# Patient Record
Sex: Female | Born: 1990 | Race: Black or African American | Hispanic: No | Marital: Single | Smoking: Never smoker
Health system: Southern US, Community
[De-identification: ages and names within clinical notes are randomized; demographics above are authoritative.]

## PROBLEM LIST (undated history)

## (undated) DIAGNOSIS — D649 Anemia, unspecified: Secondary | ICD-10-CM

## (undated) DIAGNOSIS — O1490 Unspecified pre-eclampsia, unspecified trimester: Secondary | ICD-10-CM

---
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
## 2009-05-06 ENCOUNTER — Emergency Department (HOSPITAL_COMMUNITY): Admission: EM | Admit: 2009-05-06 | Discharge: 2009-05-06 | Payer: Self-pay | Admitting: Emergency Medicine

## 2009-06-18 ENCOUNTER — Emergency Department (HOSPITAL_COMMUNITY): Admission: EM | Admit: 2009-06-18 | Discharge: 2009-06-18 | Payer: Self-pay | Admitting: Emergency Medicine

## 2009-06-19 ENCOUNTER — Emergency Department (HOSPITAL_COMMUNITY): Admission: EM | Admit: 2009-06-19 | Discharge: 2009-06-19 | Payer: Self-pay | Admitting: Emergency Medicine

## 2009-06-24 ENCOUNTER — Emergency Department (HOSPITAL_COMMUNITY): Admission: EM | Admit: 2009-06-24 | Discharge: 2009-06-25 | Payer: Self-pay | Admitting: Emergency Medicine

## 2010-07-28 ENCOUNTER — Emergency Department (HOSPITAL_COMMUNITY)
Admission: EM | Admit: 2010-07-28 | Discharge: 2010-07-28 | Payer: Self-pay | Source: Home / Self Care | Admitting: Emergency Medicine

## 2010-12-23 LAB — URINALYSIS, ROUTINE W REFLEX MICROSCOPIC
Glucose, UA: NEGATIVE mg/dL
Ketones, ur: NEGATIVE mg/dL
Nitrite: NEGATIVE
Specific Gravity, Urine: 1.024 (ref 1.005–1.030)
pH: 6.5 (ref 5.0–8.0)

## 2010-12-23 LAB — PREGNANCY, URINE: Preg Test, Ur: NEGATIVE

## 2010-12-23 LAB — URINE MICROSCOPIC-ADD ON

## 2011-01-15 LAB — COMPREHENSIVE METABOLIC PANEL
BUN: 8 mg/dL (ref 6–23)
CO2: 32 mEq/L (ref 19–32)
Calcium: 9.2 mg/dL (ref 8.4–10.5)
Creatinine, Ser: 0.77 mg/dL (ref 0.4–1.2)
GFR calc non Af Amer: >60 mL/min (ref >60)
Glucose, Bld: 105 mg/dL — ABNORMAL HIGH (ref 70–99)

## 2011-01-15 LAB — URINALYSIS, ROUTINE W REFLEX MICROSCOPIC
Bilirubin Urine: NEGATIVE
Ketones, ur: NEGATIVE mg/dL
Nitrite: NEGATIVE
Specific Gravity, Urine: 1.025 (ref 1.005–1.030)
Urobilinogen, UA: 1 mg/dL (ref 0.0–1.0)

## 2011-01-15 LAB — DIFFERENTIAL
Eosinophils Absolute: 0 10*3/uL (ref 0.0–0.7)
Lymphocytes Relative: 10 % — ABNORMAL LOW (ref 12–46)
Lymphs Abs: 1.5 10*3/uL (ref 0.7–4.0)
Neutro Abs: 12.8 10*3/uL — ABNORMAL HIGH (ref 1.7–7.7)
Neutrophils Relative %: 85 % — ABNORMAL HIGH (ref 43–77)

## 2011-01-15 LAB — POCT PREGNANCY, URINE: Preg Test, Ur: NEGATIVE

## 2011-01-15 LAB — CBC
Hemoglobin: 11.7 g/dL — ABNORMAL LOW (ref 12.0–15.0)
MCHC: 33.5 g/dL (ref 30.0–36.0)
MCV: 90.3 fL (ref 78.0–100.0)
RBC: 3.86 MIL/uL — ABNORMAL LOW (ref 3.87–5.11)
RDW: 12.4 % (ref 11.5–15.5)

## 2011-01-15 LAB — LIPASE, BLOOD: Lipase: 17 U/L (ref 11–59)

## 2011-01-26 LAB — POCT RAPID STREP A (OFFICE): Streptococcus, Group A Screen (Direct): NEGATIVE

## 2011-01-27 ENCOUNTER — Emergency Department (HOSPITAL_COMMUNITY)
Admission: EM | Admit: 2011-01-27 | Discharge: 2011-01-27 | Disposition: A | Discharge status: Home / Self Care | Payer: Self-pay | Attending: Emergency Medicine | Admitting: Emergency Medicine

## 2011-01-27 DIAGNOSIS — L298 Other pruritus: Secondary | ICD-10-CM | POA: Insufficient documentation

## 2011-01-27 DIAGNOSIS — L509 Urticaria, unspecified: Secondary | ICD-10-CM | POA: Insufficient documentation

## 2011-01-27 DIAGNOSIS — L2989 Other pruritus: Secondary | ICD-10-CM | POA: Insufficient documentation

## 2011-01-27 DIAGNOSIS — T7840XA Allergy, unspecified, initial encounter: Secondary | ICD-10-CM | POA: Insufficient documentation

## 2011-01-27 DIAGNOSIS — R21 Rash and other nonspecific skin eruption: Secondary | ICD-10-CM | POA: Insufficient documentation

## 2011-01-27 DIAGNOSIS — I10 Essential (primary) hypertension: Secondary | ICD-10-CM | POA: Insufficient documentation

## 2011-03-07 ENCOUNTER — Emergency Department (HOSPITAL_COMMUNITY)
Admission: EM | Admit: 2011-03-07 | Discharge: 2011-03-08 | Disposition: A | Discharge status: Home / Self Care | Payer: Self-pay | Attending: Emergency Medicine | Admitting: Emergency Medicine

## 2011-03-07 DIAGNOSIS — L299 Pruritus, unspecified: Secondary | ICD-10-CM | POA: Insufficient documentation

## 2011-03-07 DIAGNOSIS — M79609 Pain in unspecified limb: Secondary | ICD-10-CM | POA: Insufficient documentation

## 2011-03-07 DIAGNOSIS — L509 Urticaria, unspecified: Secondary | ICD-10-CM | POA: Insufficient documentation

## 2011-03-28 ENCOUNTER — Emergency Department (HOSPITAL_COMMUNITY)
Admission: EM | Admit: 2011-03-28 | Discharge: 2011-03-28 | Disposition: A | Discharge status: Home / Self Care | Payer: Self-pay | Attending: Emergency Medicine | Admitting: Emergency Medicine

## 2011-03-28 DIAGNOSIS — H01009 Unspecified blepharitis unspecified eye, unspecified eyelid: Secondary | ICD-10-CM | POA: Insufficient documentation

## 2011-03-28 DIAGNOSIS — H5789 Other specified disorders of eye and adnexa: Secondary | ICD-10-CM | POA: Insufficient documentation

## 2011-07-13 LAB — URINE MICROSCOPIC-ADD ON

## 2011-07-13 LAB — WET PREP, GENITAL: Trich, Wet Prep: NONE SEEN

## 2011-07-13 LAB — CBC
MCV: 88.8
RBC: 4.1
WBC: 6.5

## 2011-07-13 LAB — POCT I-STAT, CHEM 8
Calcium, Ion: 1.14
Glucose, Bld: 90
HCT: 37
Hemoglobin: 12.6

## 2011-07-13 LAB — POCT PREGNANCY, URINE: Preg Test, Ur: NEGATIVE

## 2011-07-13 LAB — URINALYSIS, ROUTINE W REFLEX MICROSCOPIC
Ketones, ur: NEGATIVE
Nitrite: NEGATIVE
pH: 6

## 2011-07-13 LAB — DIFFERENTIAL
Lymphs Abs: 2.7
Monocytes Relative: 8
Neutro Abs: 3.1
Neutrophils Relative %: 48

## 2011-07-13 LAB — GC/CHLAMYDIA PROBE AMP, GENITAL
Chlamydia, DNA Probe: NEGATIVE
GC Probe Amp, Genital: NEGATIVE

## 2012-06-14 ENCOUNTER — Other Ambulatory Visit (HOSPITAL_COMMUNITY): Payer: Self-pay | Admitting: Obstetrics and Gynecology

## 2012-06-14 DIAGNOSIS — Z3689 Encounter for other specified antenatal screening: Secondary | ICD-10-CM

## 2012-06-15 ENCOUNTER — Encounter (HOSPITAL_COMMUNITY): Payer: Self-pay

## 2012-06-15 ENCOUNTER — Ambulatory Visit (HOSPITAL_COMMUNITY)
Admission: RE | Admit: 2012-06-15 | Discharge: 2012-06-15 | Disposition: A | Discharge status: Home / Self Care | Payer: Medicaid Other | Source: Ambulatory Visit | Attending: Obstetrics and Gynecology | Admitting: Obstetrics and Gynecology

## 2012-06-15 DIAGNOSIS — O358XX0 Maternal care for other (suspected) fetal abnormality and damage, not applicable or unspecified: Secondary | ICD-10-CM | POA: Insufficient documentation

## 2012-06-15 DIAGNOSIS — Z3689 Encounter for other specified antenatal screening: Secondary | ICD-10-CM

## 2012-06-15 DIAGNOSIS — Z1389 Encounter for screening for other disorder: Secondary | ICD-10-CM | POA: Insufficient documentation

## 2012-06-15 DIAGNOSIS — Z363 Encounter for antenatal screening for malformations: Secondary | ICD-10-CM | POA: Insufficient documentation

## 2012-08-09 ENCOUNTER — Other Ambulatory Visit: Payer: Self-pay

## 2012-08-15 ENCOUNTER — Other Ambulatory Visit (HOSPITAL_COMMUNITY): Payer: Self-pay | Admitting: Obstetrics and Gynecology

## 2012-08-15 DIAGNOSIS — IMO0002 Reserved for concepts with insufficient information to code with codable children: Secondary | ICD-10-CM

## 2012-08-16 ENCOUNTER — Ambulatory Visit (HOSPITAL_COMMUNITY)
Admission: RE | Admit: 2012-08-16 | Discharge: 2012-08-16 | Disposition: A | Discharge status: Home / Self Care | Payer: Medicaid Other | Source: Ambulatory Visit | Attending: Obstetrics and Gynecology | Admitting: Obstetrics and Gynecology

## 2012-08-16 ENCOUNTER — Inpatient Hospital Stay (HOSPITAL_COMMUNITY)
Admission: AD | Admit: 2012-08-16 | Discharge: 2012-08-20 | DRG: 765 | Disposition: A | Discharge status: Home / Self Care | Payer: Medicaid Other | Source: Ambulatory Visit | Attending: Obstetrics and Gynecology | Admitting: Obstetrics and Gynecology

## 2012-08-16 ENCOUNTER — Encounter (HOSPITAL_COMMUNITY): Payer: Self-pay

## 2012-08-16 VITALS — BP 149/104 | HR 60 | Wt 133.8 lb

## 2012-08-16 DIAGNOSIS — Z1389 Encounter for screening for other disorder: Secondary | ICD-10-CM | POA: Insufficient documentation

## 2012-08-16 DIAGNOSIS — Z363 Encounter for antenatal screening for malformations: Secondary | ICD-10-CM | POA: Insufficient documentation

## 2012-08-16 DIAGNOSIS — O1490 Unspecified pre-eclampsia, unspecified trimester: Secondary | ICD-10-CM

## 2012-08-16 DIAGNOSIS — O365990 Maternal care for other known or suspected poor fetal growth, unspecified trimester, not applicable or unspecified: Secondary | ICD-10-CM | POA: Diagnosis present

## 2012-08-16 DIAGNOSIS — IMO0002 Reserved for concepts with insufficient information to code with codable children: Secondary | ICD-10-CM

## 2012-08-16 DIAGNOSIS — O358XX0 Maternal care for other (suspected) fetal abnormality and damage, not applicable or unspecified: Secondary | ICD-10-CM | POA: Insufficient documentation

## 2012-08-16 DIAGNOSIS — O151 Eclampsia in labor: Principal | ICD-10-CM | POA: Diagnosis present

## 2012-08-16 HISTORY — DX: Unspecified pre-eclampsia, unspecified trimester: O14.90

## 2012-08-16 HISTORY — DX: Anemia, unspecified: D64.9

## 2012-08-16 LAB — COMPREHENSIVE METABOLIC PANEL
Albumin: 2.6 g/dL — ABNORMAL LOW (ref 3.5–5.2)
Alkaline Phosphatase: 174 U/L — ABNORMAL HIGH (ref 39–117)
BUN: 12 mg/dL (ref 6–23)
Calcium: 8.8 mg/dL (ref 8.4–10.5)
Creatinine, Ser: 0.79 mg/dL (ref 0.50–1.10)
GFR calc Af Amer: >90 mL/min (ref >90)
Glucose, Bld: 70 mg/dL (ref 70–99)
Potassium: 3.8 mEq/L (ref 3.5–5.1)
Total Protein: 6.4 g/dL (ref 6.0–8.3)

## 2012-08-16 LAB — URINE MICROSCOPIC-ADD ON

## 2012-08-16 LAB — OB RESULTS CONSOLE ABO/RH: RH Type: POSITIVE

## 2012-08-16 LAB — CBC
HCT: 32.3 % — ABNORMAL LOW (ref 36.0–46.0)
MCH: 30.6 pg (ref 26.0–34.0)
MCHC: 35.3 g/dL (ref 30.0–36.0)
RDW: 13.1 % (ref 11.5–15.5)

## 2012-08-16 LAB — URINALYSIS, ROUTINE W REFLEX MICROSCOPIC
Glucose, UA: NEGATIVE mg/dL
Hgb urine dipstick: NEGATIVE
Specific Gravity, Urine: 1.025 (ref 1.005–1.030)
pH: 6.5 (ref 5.0–8.0)

## 2012-08-16 LAB — URIC ACID: Uric Acid, Serum: 7.5 mg/dL — ABNORMAL HIGH (ref 2.4–7.0)

## 2012-08-16 LAB — OB RESULTS CONSOLE ANTIBODY SCREEN: Antibody Screen: NEGATIVE

## 2012-08-16 LAB — OB RESULTS CONSOLE RUBELLA ANTIBODY, IGM: Rubella: IMMUNE

## 2012-08-16 LAB — OB RESULTS CONSOLE HEPATITIS B SURFACE ANTIGEN: Hepatitis B Surface Ag: NEGATIVE

## 2012-08-16 LAB — OB RESULTS CONSOLE RPR: RPR: NONREACTIVE

## 2012-08-16 LAB — LACTATE DEHYDROGENASE: LDH: 281 U/L — ABNORMAL HIGH (ref 94–250)

## 2012-08-16 MED ORDER — BETAMETHASONE SOD PHOS & ACET 6 (3-3) MG/ML IJ SUSP
12.0000 mg | Freq: Once | INTRAMUSCULAR | Status: AC
  Administered 2012-08-17: 12 mg via INTRAMUSCULAR
  Filled 2012-08-16: qty 2

## 2012-08-16 MED ORDER — PRENATAL MULTIVITAMIN CH
1.0000 | ORAL_TABLET | Freq: Every day | ORAL | Status: DC
  Administered 2012-08-17: 1 via ORAL
  Filled 2012-08-16: qty 1

## 2012-08-16 MED ORDER — ACETAMINOPHEN 325 MG PO TABS: 650.0000 mg | ORAL_TABLET | ORAL | Status: DC | PRN

## 2012-08-16 MED ORDER — ZOLPIDEM TARTRATE 5 MG PO TABS: 5.0000 mg | ORAL_TABLET | Freq: Every evening | ORAL | Status: DC | PRN

## 2012-08-16 MED ORDER — DOCUSATE SODIUM 100 MG PO CAPS
100.0000 mg | ORAL_CAPSULE | Freq: Every day | ORAL | Status: DC
  Administered 2012-08-17: 100 mg via ORAL
  Filled 2012-08-16: qty 1

## 2012-08-16 MED ORDER — CALCIUM CARBONATE ANTACID 500 MG PO CHEW: 2.0000 | CHEWABLE_TABLET | ORAL | Status: DC | PRN

## 2012-08-16 MED ORDER — BETAMETHASONE SOD PHOS & ACET 6 (3-3) MG/ML IJ SUSP
12.0000 mg | Freq: Once | INTRAMUSCULAR | Status: AC
  Administered 2012-08-16: 12 mg via INTRAMUSCULAR
  Filled 2012-08-16: qty 2

## 2012-08-16 NOTE — MAU Note (Signed)
Patient is sent from maternal fetal medicine due to elevated bp (she went for an ultrasound due to possible sga). She states that it is the first time she is aware of high bp. Patient denies visual disturbance, epigastric pain, vaginal bleeding or lof. She c/o headache 4/10. She reports good fetal movement. 1+ reflexes  And no clonus.

## 2012-08-16 NOTE — Progress Notes (Signed)
Dr Ellyn Hack called and gave orders to admit to antenatal, administer first dose of betamethasone 12mg  im and start 24 hour urine collections. She states that she will enter the rest of admission orders.

## 2012-08-16 NOTE — MAU Provider Note (Signed)
  History     CSN: [id number]  Arrival date and time: 08/16/12 1213   None     Chief Complaint  Patient presents with  . Hypertension   HPI  Pt is [redacted]w[redacted]d pregnant and presents from MFM after having BP elevation. She has been seen for serial ultrasounds for SGA.  She has an occ contraction.  She has a frontal headache since she woke up this morning.  She has eaten cereal this morning at 10 am and has drunk a little water.    Past Medical History  Diagnosis Date  . Anemia     History reviewed. No pertinent past surgical history.  History reviewed. No pertinent family history.  History  Substance Use Topics  . Smoking status: Never Smoker   . Smokeless tobacco: Not on file  . Alcohol Use: No    Allergies: No Known Allergies  Prescriptions prior to admission  Medication Sig Dispense Refill  . ferrous sulfate 325 (65 FE) MG tablet Take 325 mg by mouth 2 (two) times daily.      . Prenatal Vit-Fe Fumarate-FA (PRENATAL MULTIVITAMIN) TABS Take 1 tablet by mouth daily.        ROS Physical Exam   Last menstrual period 12/12/2011.  Physical Exam  MAU Course  Procedures Discussed with Dr. Ellyn Hack- will go ahead and give 1st dose Betamethasone Awaiting labs- Dr. Ellyn Hack will admit  Assessment and Plan    Olivia Farrell 08/16/2012, 1:19 PM

## 2012-08-16 NOTE — Addendum Note (Signed)
Encounter addended by: Durwin Nora, MD on: 08/16/2012  2:38 PM<BR>     Documentation filed: Visit Diagnoses

## 2012-08-16 NOTE — H&P (Signed)
Olivia Farrell is a 21 y.o. female severe HTN, PreEclampsia admitted for 24hr urine, also with SGA baby (20%) and grade 2-3 placenta, evaluated in MFM today.  Late PNC at 24week, dated by 24 wk [place], nl anat, EIF noted.. Maternal Medical History:  Fetal activity: Perceived fetal activity is normal.    Prenatal complications: Hypertension and pre-eclampsia.     OB History    Grav Para Term Preterm Abortions TAB SAB Ect Mult Living   1             G1 present, late PNC, no abn pap, + Chl Past Medical History  Diagnosis Date  . Anemia   . Preeclampsia 08/16/2012   History reviewed. No pertinent past surgical history. Family History: Colon CA, DM type 1, HTN, kidney dz Social History:  reports that she has never smoked. She does not have any smokeless tobacco history on file. She reports that she does not drink alcohol or use illicit drugs. Meds PNV All NKDA   Prenatal Transfer Tool  Maternal Diabetes: No Genetic Screening: Declined too late to care, EIF noted on [place] Maternal Ultrasounds/Referrals: Abnormal:  Findings:   Isolated EIF (echogenic intracardiac focus), Other: late care, grade 2-3 placenta Fetal Ultrasounds or other Referrals:  Referred to Materal Fetal Medicine  Maternal Substance Abuse:  No Significant Maternal Medications:  None Significant Maternal Lab Results:  None Other Comments:  late to care - 24 week  Review of Systems  Constitutional: Negative.   Eyes: Positive for blurred vision.  Respiratory: Negative.   Cardiovascular: Negative.   Gastrointestinal: Negative.   Genitourinary: Negative.   Musculoskeletal: Negative.   Skin: Negative.   Neurological: Positive for headaches.  Psychiatric/Behavioral: Negative.       Blood pressure 141/75, pulse 61, temperature 98.2 F (36.8 C), temperature source Oral, resp. rate 24, height 5\' 1"  (1.549 m), weight 59.875 kg (132 lb), last menstrual period 12/12/2011. Maternal Exam:  Abdomen: Fundal height is  appropriate for gestation.   Estimated fetal weight is 28% on [place] 11/6.    Introitus: Normal vulva. Normal vagina.    Physical Exam  Constitutional: She is oriented to person, place, and time. She appears well-developed and well-nourished.  HENT:  Head: Normocephalic.  Eyes: Conjunctivae normal are normal. Pupils are equal, round, and reactive to light.  Neck: Normal range of motion. Neck supple.  Cardiovascular: Normal rate and regular rhythm.   Respiratory: Effort normal and breath sounds normal. No respiratory distress.  GI: Soft. Bowel sounds are normal. There is no tenderness.  Musculoskeletal: Normal range of motion.  Neurological: She is alert and oriented to person, place, and time.  Skin: Skin is warm and dry.  Psychiatric: She has a normal mood and affect. Her behavior is normal.    Prenatal labs: ABO, Rh:  B+ Antibody:  neg Rubella:  immune RPR:   NR HBsAg:   neg HIV:   neg GBS:   unknown Hgb 9.6/ glucol 113/ pap WNL/ Ur Cx neg/ Plt 304K/ Hgb electro WNL/ GC neg/ Chl +/ CF neg/   [place] dated by 24 wk [place] Nl anat, except EIF, grade 2-3 placenta EDC 12/26  Tdap 9/25 Flu 10/8  Assessment/Plan: 21yo G1P0 at 32+ with PreE, BP very elevated in MFM, have been WNL at office.  Growth 28% on [place], nl AFI, nl dopplers (not documented) Getting BMZ 24hr urine D/w pt POC   BOVARD,Nobel Brar 08/16/2012, 6:14 PM

## 2012-08-16 NOTE — Progress Notes (Signed)
Olivia Farrell had an ultrasound appointment today.  Please see AS-OB/GYN report for details.  Comments An active singleton fetus is observed.  Biometry is appropriate for gestational age, noting overall symmetric fetal growth pattern (HC/AC 1.02) and EFW at the 28th percentile. AC is at the 10th percentile.  Amniotic fluid volume is normal.  Screening survey of the fetal anatomy was performed and no structural defects are detected but there is an echogenic intracardiac focus in the left ventricle.    Today's discussion was limited to pertinent ultrasound findings.  In addition, there is an echogenic intracardiac focus of the left ventricle. I demonstrated the echogenic intracardiac focus (EIF) on the ultrasound screen.  This finding has been implicated as a marker for increased risk of aneuploidy, especially Trisomy 21. However, it is also seen as a normal variant in 2-4% of all pregnancy ultrasound exams.  Although its significance as an isolated finding is not entirely clear, a consensus opinion would be that an isolated EIF (with no other risk factors or suspicious ultrasound findings) does not significantly increase risk of aneuploidy.  With her relatively young age and absence of other sonographic findings,her risk for chromosome abnormality is not increased to the point that amniocentesis is recommended. Therefore, I did not specifically mention this to your patient as for her this finding is essentially a "normal variant" and does not change her risk stratification as she remains "low risk."  Furthermore, echogenic focus is not a "heart defect" and no special fetal surveillance or neonatal cardiac evaluation is recommended.  Moreover, the patient was noted to have BP's of 159/98 and 148/109, prompting my call to Dr. Ebony Hail office to notify them that the patient was being sent to the MAU for evaluation for possible preeclampsia.  Impression Active singleton fetus EIF EFW at 28th percentile  with [facility] at the 10th percentile Severe hypertension, needs evaluation for preeclampsia  Recommendations 1. The patient was sent to MAU for evaluation of possible preeclampsia. 2. Due to borderline AC measurement, I would consider reassessing interval growth in 3-4 weeks by ultrasound, provided no clinical indication for delivery is found in the interim. 3. Follow up as clinically indicated.  Rogelia Boga, MD, MS, FACOG Assistant Professor Section of Maternal-Fetal Medicine [facility]

## 2012-08-17 ENCOUNTER — Encounter (HOSPITAL_COMMUNITY)
Admission: AD | Disposition: A | Discharge status: Home / Self Care | Payer: Self-pay | Source: Ambulatory Visit | Attending: Obstetrics and Gynecology

## 2012-08-17 ENCOUNTER — Inpatient Hospital Stay (HOSPITAL_COMMUNITY): Payer: Medicaid Other

## 2012-08-17 ENCOUNTER — Encounter (HOSPITAL_COMMUNITY): Payer: Self-pay | Admitting: Anesthesiology

## 2012-08-17 ENCOUNTER — Encounter (HOSPITAL_COMMUNITY): Payer: Self-pay

## 2012-08-17 ENCOUNTER — Encounter (HOSPITAL_COMMUNITY): Payer: Self-pay | Admitting: *Deleted

## 2012-08-17 LAB — CBC
HCT: 33.2 % — ABNORMAL LOW (ref 36.0–46.0)
HCT: 36.6 % (ref 36.0–46.0)
Hemoglobin: 11.7 g/dL — ABNORMAL LOW (ref 12.0–15.0)
Hemoglobin: 12.5 g/dL (ref 12.0–15.0)
MCH: 30.2 pg (ref 26.0–34.0)
MCHC: 34.2 g/dL (ref 30.0–36.0)
MCV: 87.1 fL (ref 78.0–100.0)
RBC: 3.81 MIL/uL — ABNORMAL LOW (ref 3.87–5.11)
RDW: 13.2 % (ref 11.5–15.5)
WBC: 13.4 10*3/uL — ABNORMAL HIGH (ref 4.0–10.5)

## 2012-08-17 LAB — BASIC METABOLIC PANEL
BUN: 17 mg/dL (ref 6–23)
CO2: 19 mEq/L (ref 19–32)
Chloride: 100 mEq/L (ref 96–112)
Creatinine, Ser: 0.77 mg/dL (ref 0.50–1.10)
GFR calc Af Amer: >90 mL/min (ref >90)
Potassium: 4.2 mEq/L (ref 3.5–5.1)

## 2012-08-17 LAB — COMPREHENSIVE METABOLIC PANEL WITH GFR
ALT: 12 U/L (ref 0–35)
AST: 30 U/L (ref 0–37)
Albumin: 2.9 g/dL — ABNORMAL LOW (ref 3.5–5.2)
Alkaline Phosphatase: 217 U/L — ABNORMAL HIGH (ref 39–117)
BUN: 16 mg/dL (ref 6–23)
CO2: 14 meq/L — ABNORMAL LOW (ref 19–32)
Calcium: 9.5 mg/dL (ref 8.4–10.5)
Chloride: 97 meq/L (ref 96–112)
Creatinine, Ser: 0.89 mg/dL (ref 0.50–1.10)
GFR calc Af Amer: >90 mL/min
GFR calc non Af Amer: >90 mL/min
Glucose, Bld: 102 mg/dL — ABNORMAL HIGH (ref 70–99)
Potassium: 3.6 meq/L (ref 3.5–5.1)
Sodium: 134 meq/L — ABNORMAL LOW (ref 135–145)
Total Bilirubin: 0.3 mg/dL (ref 0.3–1.2)
Total Protein: 6.8 g/dL (ref 6.0–8.3)

## 2012-08-17 LAB — CREATININE CLEARANCE, URINE, 24 HOUR
Collection Interval-CRCL: 24 hours
Creatinine, Urine: 98.52 mg/dL
Urine Total Volume-CRCL: 1300 mL

## 2012-08-17 LAB — PROTIME-INR: Prothrombin Time: 12.2 seconds (ref 11.6–15.2)

## 2012-08-17 LAB — LACTATE DEHYDROGENASE: LDH: 390 U/L — ABNORMAL HIGH (ref 94–250)

## 2012-08-17 LAB — PROTEIN, URINE, 24 HOUR: Protein, 24H Urine: 3471 mg/d — ABNORMAL HIGH (ref 50–100)

## 2012-08-17 SURGERY — Surgical Case
Anesthesia: Spinal | Site: Abdomen | Wound class: Clean Contaminated

## 2012-08-17 MED ORDER — PHENYLEPHRINE 40 MCG/ML (10ML) SYRINGE FOR IV PUSH (FOR BLOOD PRESSURE SUPPORT): 80.0000 ug | PREFILLED_SYRINGE | INTRAVENOUS | Status: DC | PRN

## 2012-08-17 MED ORDER — MORPHINE SULFATE (PF) 0.5 MG/ML IJ SOLN
INTRAMUSCULAR | Status: DC | PRN
  Administered 2012-08-17: .1 mg via INTRATHECAL

## 2012-08-17 MED ORDER — LIDOCAINE HCL (PF) 1 % IJ SOLN: 30.0000 mL | INTRAMUSCULAR | Status: DC | PRN

## 2012-08-17 MED ORDER — PRENATAL MULTIVITAMIN CH
1.0000 | ORAL_TABLET | Freq: Every day | ORAL | Status: DC
  Administered 2012-08-18 – 2012-08-19 (×2): 1 via ORAL
  Filled 2012-08-17 (×2): qty 1

## 2012-08-17 MED ORDER — SENNOSIDES-DOCUSATE SODIUM 8.6-50 MG PO TABS
2.0000 | ORAL_TABLET | Freq: Every day | ORAL | Status: DC
  Administered 2012-08-18 – 2012-08-19 (×3): 2 via ORAL

## 2012-08-17 MED ORDER — OXYTOCIN 40 UNITS IN LACTATED RINGERS INFUSION - SIMPLE MED: 62.5000 mL/h | INTRAVENOUS | Status: DC

## 2012-08-17 MED ORDER — METOCLOPRAMIDE HCL 5 MG/ML IJ SOLN: 10.0000 mg | Freq: Three times a day (TID) | INTRAMUSCULAR | Status: DC | PRN

## 2012-08-17 MED ORDER — KETOROLAC TROMETHAMINE 30 MG/ML IJ SOLN
30.0000 mg | Freq: Four times a day (QID) | INTRAMUSCULAR | Status: AC | PRN
  Administered 2012-08-17: 30 mg via INTRAVENOUS

## 2012-08-17 MED ORDER — FAMOTIDINE IN NACL 20-0.9 MG/50ML-% IV SOLN
20.0000 mg | Freq: Once | INTRAVENOUS | Status: AC
  Administered 2012-08-17: 20 mg via INTRAVENOUS
  Filled 2012-08-17: qty 50

## 2012-08-17 MED ORDER — DIPHENHYDRAMINE HCL 50 MG/ML IJ SOLN
12.5000 mg | INTRAMUSCULAR | Status: DC | PRN
  Administered 2012-08-18: 12.5 mg via INTRAVENOUS
  Filled 2012-08-17: qty 1

## 2012-08-17 MED ORDER — CEFAZOLIN SODIUM-DEXTROSE 2-3 GM-% IV SOLR
INTRAVENOUS | Status: DC | PRN
  Administered 2012-08-17: 2 g via INTRAVENOUS

## 2012-08-17 MED ORDER — DEXTROSE-NACL 5-0.45 % IV SOLN
INTRAVENOUS | Status: DC
  Administered 2012-08-18 – 2012-08-19 (×3): via INTRAVENOUS

## 2012-08-17 MED ORDER — LACTATED RINGERS IV SOLN
INTRAVENOUS | Status: DC | PRN
  Administered 2012-08-17: 19:00:00 via INTRAVENOUS

## 2012-08-17 MED ORDER — EPHEDRINE 5 MG/ML INJ: 10.0000 mg | INTRAVENOUS | Status: DC | PRN

## 2012-08-17 MED ORDER — MIDAZOLAM HCL 2 MG/2ML IJ SOLN: 0.5000 mg | Freq: Once | INTRAMUSCULAR | Status: DC | PRN

## 2012-08-17 MED ORDER — MEPERIDINE HCL 25 MG/ML IJ SOLN
INTRAMUSCULAR | Status: AC
  Filled 2012-08-17: qty 1

## 2012-08-17 MED ORDER — LANOLIN HYDROUS EX OINT: 1.0000 "application " | TOPICAL_OINTMENT | CUTANEOUS | Status: DC | PRN

## 2012-08-17 MED ORDER — PENICILLIN G POTASSIUM 5000000 UNITS IJ SOLR
2.5000 10*6.[IU] | INTRAVENOUS | Status: DC
  Filled 2012-08-17 (×3): qty 2.5

## 2012-08-17 MED ORDER — SODIUM CHLORIDE 0.9 % IV SOLN
1.0000 ug/kg/h | INTRAVENOUS | Status: DC | PRN
  Filled 2012-08-17: qty 2.5

## 2012-08-17 MED ORDER — FENTANYL CITRATE 0.05 MG/ML IJ SOLN: 25.0000 ug | INTRAMUSCULAR | Status: DC | PRN

## 2012-08-17 MED ORDER — NALBUPHINE SYRINGE 5 MG/0.5 ML
5.0000 mg | INJECTION | INTRAMUSCULAR | Status: DC | PRN
  Filled 2012-08-17: qty 1

## 2012-08-17 MED ORDER — ONDANSETRON HCL 4 MG PO TABS: 4.0000 mg | ORAL_TABLET | ORAL | Status: DC | PRN

## 2012-08-17 MED ORDER — NALBUPHINE HCL 10 MG/ML IJ SOLN
5.0000 mg | INTRAMUSCULAR | Status: DC | PRN
  Filled 2012-08-17: qty 1

## 2012-08-17 MED ORDER — ACETAMINOPHEN 325 MG PO TABS: 650.0000 mg | ORAL_TABLET | ORAL | Status: DC | PRN

## 2012-08-17 MED ORDER — DIPHENHYDRAMINE HCL 50 MG/ML IJ SOLN: 12.5000 mg | INTRAMUSCULAR | Status: DC | PRN

## 2012-08-17 MED ORDER — ONDANSETRON HCL 4 MG/2ML IJ SOLN: 4.0000 mg | INTRAMUSCULAR | Status: DC | PRN

## 2012-08-17 MED ORDER — OXYCODONE-ACETAMINOPHEN 5-325 MG PO TABS: 1.0000 | ORAL_TABLET | ORAL | Status: DC | PRN

## 2012-08-17 MED ORDER — SCOPOLAMINE 1 MG/3DAYS TD PT72
MEDICATED_PATCH | TRANSDERMAL | Status: AC
  Filled 2012-08-17: qty 1

## 2012-08-17 MED ORDER — BUPIVACAINE IN DEXTROSE 0.75-8.25 % IT SOLN
INTRATHECAL | Status: DC | PRN
  Administered 2012-08-17: 1.5 mL via INTRATHECAL

## 2012-08-17 MED ORDER — MIDAZOLAM HCL 2 MG/2ML IJ SOLN
INTRAMUSCULAR | Status: AC
  Filled 2012-08-17: qty 2

## 2012-08-17 MED ORDER — FENTANYL CITRATE 0.05 MG/ML IJ SOLN
INTRAMUSCULAR | Status: AC
  Filled 2012-08-17: qty 2

## 2012-08-17 MED ORDER — IBUPROFEN 600 MG PO TABS
600.0000 mg | ORAL_TABLET | Freq: Four times a day (QID) | ORAL | Status: DC
  Administered 2012-08-18 – 2012-08-20 (×10): 600 mg via ORAL
  Filled 2012-08-17 (×11): qty 1

## 2012-08-17 MED ORDER — PENICILLIN G POTASSIUM 5000000 UNITS IJ SOLR
5.0000 10*6.[IU] | Freq: Once | INTRAVENOUS | Status: AC
  Administered 2012-08-17: 5 10*6.[IU] via INTRAVENOUS
  Filled 2012-08-17: qty 5

## 2012-08-17 MED ORDER — DIBUCAINE 1 % RE OINT: 1.0000 "application " | TOPICAL_OINTMENT | RECTAL | Status: DC | PRN

## 2012-08-17 MED ORDER — DIAZEPAM 5 MG/ML IJ SOLN
INTRAMUSCULAR | Status: AC
  Administered 2012-08-17: 2 mg
  Filled 2012-08-17: qty 2

## 2012-08-17 MED ORDER — OXYCODONE-ACETAMINOPHEN 5-325 MG PO TABS
1.0000 | ORAL_TABLET | ORAL | Status: DC | PRN
  Administered 2012-08-19 – 2012-08-20 (×3): 1 via ORAL
  Filled 2012-08-17 (×3): qty 1

## 2012-08-17 MED ORDER — MAGNESIUM SULFATE BOLUS VIA INFUSION
6.0000 g | Freq: Once | INTRAVENOUS | Status: AC
  Administered 2012-08-17: 6 g via INTRAVENOUS
  Filled 2012-08-17: qty 500

## 2012-08-17 MED ORDER — ONDANSETRON HCL 4 MG/2ML IJ SOLN
4.0000 mg | Freq: Four times a day (QID) | INTRAMUSCULAR | Status: DC | PRN
  Administered 2012-08-17: 4 mg via INTRAVENOUS
  Filled 2012-08-17: qty 2

## 2012-08-17 MED ORDER — MAGNESIUM SULFATE 40 G IN LACTATED RINGERS - SIMPLE
3.0000 g/h | INTRAVENOUS | Status: DC
  Administered 2012-08-18: 3 g/h via INTRAVENOUS
  Filled 2012-08-17 (×2): qty 500

## 2012-08-17 MED ORDER — OXYCODONE HCL 5 MG PO TABS: 5.0000 mg | ORAL_TABLET | ORAL | Status: DC | PRN

## 2012-08-17 MED ORDER — FENTANYL CITRATE 0.05 MG/ML IJ SOLN
INTRAMUSCULAR | Status: DC | PRN
  Administered 2012-08-17: 25 ug via INTRATHECAL

## 2012-08-17 MED ORDER — WITCH HAZEL-GLYCERIN EX PADS: 1.0000 "application " | MEDICATED_PAD | CUTANEOUS | Status: DC | PRN

## 2012-08-17 MED ORDER — LACTATED RINGERS IV SOLN: 500.0000 mL | INTRAVENOUS | Status: DC | PRN

## 2012-08-17 MED ORDER — MEPERIDINE HCL 25 MG/ML IJ SOLN
6.2500 mg | INTRAMUSCULAR | Status: DC | PRN
  Administered 2012-08-17: 6.25 mg via INTRAVENOUS

## 2012-08-17 MED ORDER — MAGNESIUM SULFATE 40 G IN LACTATED RINGERS - SIMPLE
2.0000 g/h | INTRAVENOUS | Status: DC
  Filled 2012-08-17: qty 500

## 2012-08-17 MED ORDER — OXYTOCIN 40 UNITS IN LACTATED RINGERS INFUSION - SIMPLE MED
1.0000 m[IU]/min | INTRAVENOUS | Status: DC
  Administered 2012-08-17: 2 m[IU]/min via INTRAVENOUS
  Filled 2012-08-17: qty 1000

## 2012-08-17 MED ORDER — KETOROLAC TROMETHAMINE 30 MG/ML IJ SOLN: 30.0000 mg | Freq: Four times a day (QID) | INTRAMUSCULAR | Status: AC | PRN

## 2012-08-17 MED ORDER — LACTATED RINGERS IV SOLN: 500.0000 mL | Freq: Once | INTRAVENOUS | Status: DC

## 2012-08-17 MED ORDER — CEFAZOLIN SODIUM-DEXTROSE 2-3 GM-% IV SOLR
INTRAVENOUS | Status: AC
  Filled 2012-08-17: qty 50

## 2012-08-17 MED ORDER — OXYTOCIN 10 UNIT/ML IJ SOLN
40.0000 [IU] | INTRAVENOUS | Status: DC | PRN
  Administered 2012-08-17: 40 [IU] via INTRAVENOUS

## 2012-08-17 MED ORDER — ONDANSETRON HCL 4 MG/2ML IJ SOLN
INTRAMUSCULAR | Status: AC
  Filled 2012-08-17: qty 2

## 2012-08-17 MED ORDER — LACTATED RINGERS IV SOLN: INTRAVENOUS | Status: DC

## 2012-08-17 MED ORDER — DIAZEPAM 5 MG/ML IJ SOLN
INTRAMUSCULAR | Status: AC
  Filled 2012-08-17: qty 2

## 2012-08-17 MED ORDER — OXYTOCIN BOLUS FROM INFUSION: 500.0000 mL | INTRAVENOUS | Status: DC

## 2012-08-17 MED ORDER — TERBUTALINE SULFATE 1 MG/ML IJ SOLN: 0.2500 mg | Freq: Once | INTRAMUSCULAR | Status: DC | PRN

## 2012-08-17 MED ORDER — SIMETHICONE 80 MG PO CHEW
80.0000 mg | CHEWABLE_TABLET | Freq: Three times a day (TID) | ORAL | Status: DC
  Administered 2012-08-18 – 2012-08-20 (×8): 80 mg via ORAL

## 2012-08-17 MED ORDER — IBUPROFEN 600 MG PO TABS: 600.0000 mg | ORAL_TABLET | Freq: Four times a day (QID) | ORAL | Status: DC | PRN

## 2012-08-17 MED ORDER — DIPHENHYDRAMINE HCL 50 MG/ML IJ SOLN: 25.0000 mg | INTRAMUSCULAR | Status: DC | PRN

## 2012-08-17 MED ORDER — SODIUM CHLORIDE 0.9 % IJ SOLN
3.0000 mL | INTRAMUSCULAR | Status: DC | PRN
  Administered 2012-08-18: 3 mL via INTRAVENOUS

## 2012-08-17 MED ORDER — MORPHINE SULFATE 0.5 MG/ML IJ SOLN
INTRAMUSCULAR | Status: AC
  Filled 2012-08-17: qty 10

## 2012-08-17 MED ORDER — OXYTOCIN 40 UNITS IN LACTATED RINGERS INFUSION - SIMPLE MED: 62.5000 mL/h | INTRAVENOUS | Status: AC

## 2012-08-17 MED ORDER — CITRIC ACID-SODIUM CITRATE 334-500 MG/5ML PO SOLN: 30.0000 mL | ORAL | Status: DC | PRN

## 2012-08-17 MED ORDER — ONDANSETRON HCL 4 MG/2ML IJ SOLN
INTRAMUSCULAR | Status: DC | PRN
  Administered 2012-08-17: 4 mg via INTRAVENOUS

## 2012-08-17 MED ORDER — NALOXONE HCL 0.4 MG/ML IJ SOLN
0.4000 mg | INTRAMUSCULAR | Status: DC | PRN
Start: 2012-08-17 — End: 2012-08-20

## 2012-08-17 MED ORDER — MENTHOL 3 MG MT LOZG: 1.0000 | LOZENGE | OROMUCOSAL | Status: DC | PRN

## 2012-08-17 MED ORDER — FENTANYL 2.5 MCG/ML BUPIVACAINE 1/10 % EPIDURAL INFUSION (WH - ANES): 14.0000 mL/h | INTRAMUSCULAR | Status: DC

## 2012-08-17 MED ORDER — SCOPOLAMINE 1 MG/3DAYS TD PT72
1.0000 | MEDICATED_PATCH | Freq: Once | TRANSDERMAL | Status: DC
  Administered 2012-08-17: 1.5 mg via TRANSDERMAL

## 2012-08-17 MED ORDER — TETANUS-DIPHTH-ACELL PERTUSSIS 5-2.5-18.5 LF-MCG/0.5 IM SUSP
0.5000 mL | Freq: Once | INTRAMUSCULAR | Status: DC
  Filled 2012-08-17: qty 0.5

## 2012-08-17 MED ORDER — ACETAMINOPHEN 10 MG/ML IV SOLN
1000.0000 mg | Freq: Four times a day (QID) | INTRAVENOUS | Status: AC | PRN
  Filled 2012-08-17: qty 100

## 2012-08-17 MED ORDER — ZOLPIDEM TARTRATE 5 MG PO TABS: 5.0000 mg | ORAL_TABLET | Freq: Every evening | ORAL | Status: DC | PRN

## 2012-08-17 MED ORDER — DIPHENHYDRAMINE HCL 25 MG PO CAPS: 25.0000 mg | ORAL_CAPSULE | Freq: Four times a day (QID) | ORAL | Status: DC | PRN

## 2012-08-17 MED ORDER — KETOROLAC TROMETHAMINE 30 MG/ML IJ SOLN
INTRAMUSCULAR | Status: AC
  Filled 2012-08-17: qty 1

## 2012-08-17 MED ORDER — ONDANSETRON HCL 4 MG/2ML IJ SOLN: 4.0000 mg | Freq: Three times a day (TID) | INTRAMUSCULAR | Status: DC | PRN

## 2012-08-17 MED ORDER — OXYTOCIN 10 UNIT/ML IJ SOLN
INTRAMUSCULAR | Status: AC
  Filled 2012-08-17: qty 4

## 2012-08-17 MED ORDER — DIPHENHYDRAMINE HCL 25 MG PO CAPS: 25.0000 mg | ORAL_CAPSULE | ORAL | Status: DC | PRN

## 2012-08-17 MED ORDER — SIMETHICONE 80 MG PO CHEW: 80.0000 mg | CHEWABLE_TABLET | ORAL | Status: DC | PRN

## 2012-08-17 MED FILL — Medication: Qty: 1 | Status: AC

## 2012-08-17 SURGICAL SUPPLY — 31 items
APL SKNCLS STERI-STRIP NONHPOA (GAUZE/BANDAGES/DRESSINGS) ×1
BENZOIN TINCTURE PRP APPL 2/3 (GAUZE/BANDAGES/DRESSINGS) ×1 IMPLANT
CLOTH BEACON ORANGE TIMEOUT ST (SAFETY) ×2 IMPLANT
DRAPE SURG 17X23 STRL (DRAPES) ×2 IMPLANT
DRESSING TELFA 8X3 (GAUZE/BANDAGES/DRESSINGS) ×1 IMPLANT
DRSG COVADERM 4X10 (GAUZE/BANDAGES/DRESSINGS) ×1 IMPLANT
DURAPREP 26ML APPLICATOR (WOUND CARE) ×2 IMPLANT
ELECT REM PT RETURN 9FT ADLT (ELECTROSURGICAL) ×2
ELECTRODE REM PT RTRN 9FT ADLT (ELECTROSURGICAL) ×1 IMPLANT
GAUZE SPONGE 4X4 12PLY STRL LF (GAUZE/BANDAGES/DRESSINGS) ×1 IMPLANT
GLOVE BIO SURGEON STRL SZ 6.5 (GLOVE) ×2 IMPLANT
GOWN PREVENTION PLUS LG XLONG (DISPOSABLE) ×4 IMPLANT
KIT ABG SYR 3ML LUER SLIP (SYRINGE) ×1 IMPLANT
NDL HYPO 25X5/8 SAFETYGLIDE (NEEDLE) IMPLANT
NEEDLE HYPO 25X5/8 SAFETYGLIDE (NEEDLE) ×2 IMPLANT
NS IRRIG 1000ML POUR BTL (IV SOLUTION) ×2 IMPLANT
PACK C SECTION WH (CUSTOM PROCEDURE TRAY) ×2 IMPLANT
PAD ABD 7.5X8 STRL (GAUZE/BANDAGES/DRESSINGS) ×1 IMPLANT
PAD OB MATERNITY 4.3X12.25 (PERSONAL CARE ITEMS) ×1 IMPLANT
RTRCTR C-SECT PINK 25CM LRG (MISCELLANEOUS) ×2 IMPLANT
STRIP CLOSURE SKIN 1/2X4 (GAUZE/BANDAGES/DRESSINGS) ×1 IMPLANT
SUT CHROMIC 1 CTX 36 (SUTURE) ×4 IMPLANT
SUT VIC AB 0 CT1 27 (SUTURE) ×4
SUT VIC AB 0 CT1 27XBRD ANBCTR (SUTURE) ×2 IMPLANT
SUT VIC AB 2-0 CT1 27 (SUTURE) ×2
SUT VIC AB 2-0 CT1 TAPERPNT 27 (SUTURE) IMPLANT
SUT VIC AB 4-0 KS 27 (SUTURE) ×1 IMPLANT
TAPE CLOTH SURG 4X10 WHT LF (GAUZE/BANDAGES/DRESSINGS) ×1 IMPLANT
TOWEL OR 17X24 6PK STRL BLUE (TOWEL DISPOSABLE) ×4 IMPLANT
TRAY FOLEY CATH 14FR (SET/KITS/TRAYS/PACK) ×2 IMPLANT
WATER STERILE IRR 1000ML POUR (IV SOLUTION) ×2 IMPLANT

## 2012-08-17 NOTE — Progress Notes (Signed)
Pt transferred to room 175 via bed. Pt talking to nurses and family.

## 2012-08-17 NOTE — Progress Notes (Signed)
Patient ID: Olivia Farrell, female   DOB: June 24, 1991, 21 y.o.   MRN: [id number] CTSP for eclamptic seizure  Pt was resting on couch when began to seize with family present.  Did not fall, but just fell back on couch Stat team called and seizure controlled with ativan and Magnesium.  Pt now slightly disoriented, but responsive Received second dose of betamethasone about 2pm  D/w pt and family plan of care in detail.  Baby is vertex and cervix 50/1/-2. Stat labs sent to confirm stable and if so will begin induction process while continuing magnesium for seizure prophylaxis. Will place on GBS prophylaxis for preterm status and notify NICU of delivery.

## 2012-08-17 NOTE — Op Note (Signed)
Operative report  Preoperative diagnosis Preterm pregnancy at 33 weeks Eclampsia Seizures retractory to  magnesium infusion  Postoperative diagnosis Same  Procedure Primary low transverse C-section with 2 layer closure of uterus  Surgeon Dr. Huel Cote  Anesthesia Spinal  Fluids Estimated blood loss 700 cc Urine output 50 cc clear IV fluids 400 cc intraoperatively  Findings A viable female infant in the vertex presentation Apgars were 9 and 9. Weight was 3 lbs. 13 oz. The uterus appeared normal however the fallopian tubes and ovaries did have adhesions which made them difficult to visualize as they were adherent posteriorly. The fimbriated ends were able to be traced all the tubes and the ovaries were palpably normal no adherent to the posterior surface of the uterus.  Specimen Placenta sent to pathology  Procedure note The patient had been moved to labor and delivery for induction of labor given eclamptic seizures and was  begun on Pitocin with rupture of membranes performed. She had been appropriately treated with a 6 g bolus and 2 g an hour of magnesium infusion as well as Ativan with her first seizure. Despite these medications less than an hour and a half from her first seizure the patient began seizing again. It was brought under control with an increase in the magnesium to 3 g an hour and an additional 2 mg of Ativan. The patient did remained postictal for approximately 30 minutes following the seizure. In this time frame we discussed the situation with the patient's family as she was unable to give consent herself. Given the refractory seizures remote from delivery we determined it was safest to proceed with cesarean section. As the baby was stable we allowed the patient to recover from her seizure and off to cooperate with regional anesthetic and then proceeded to the OR. As stated informed consent was obtained from the patient's mother as she was not coherent enough to  do this. In the operating room spinal anesthesia was obtained without difficulty and the patient was prepped and draped in the normal sterile fashion in the dorsal supine position with a leftward tilt. An appropriate time out was performed. A Pfannenstiel skin incision was then made with the scalpel and carried through to the underlying layer of fascia by sharp dissection and Bovie cautery. The fascia was nicked in the midline and the incision was extended laterally with Mayo scissors. The inferior aspect of the incision was grasped Coker clamps elevated and dissected off the underlying rectus muscles. In a similar fashion the superior aspect was dissected off the rectus muscles. These were separated in the midline and the peritoneal cavity entered bluntly. Peritoneal incision was then extended both superiorly and inferiorly with careful attention to avoid both bowel bladder. The Alexis self-retaining wound retractor was then placed within the incision and the lower uterine segment exposed. A bladder flap was created with Metzenbaum scissors and the lower uterine segment then incised in a transverse fashion. The uterine cavity was entered bluntly and the infant's head was then delivered atraumatically with the nose and mouth bulb suctioned. The baby had a vigorous cry and was handed off to the waiting NICU team. Cord blood was obtained and the placenta was then expressed spontaneously from the uterus. The uterus was cleared of all clots and debris with moist lap sponge. The uterus was then closed in 2 layers the first a running locked layer 1-0 chromic and the second an imbricating layer of the same suture. Any areas of oozing were controlled with Bovie  cautery. The gutters were cleared of all clots and debris and as stated the tubes and ovaries were carefully examined and found to have adhesive disease with some scarring to the posterior uterine surface. They did palpate normal otherwise. The Alexis retractor was  then removed from the abdomen and the rectus muscles and peritoneum were reapproximated with several interrupted mattress sutures of 2-0 Vicryl. The fascia was closed with 0 Vicryl in a running fashion. The subcutaneous tissue was reapproximated with 3-0 Vicryl in a running fashion. The skin was closed with 4-0 Vicryl in a subcuticular stitch on a Keith needle. All instruments and sponge counts were correct and the patient was taken to the recovery room in good condition. She will be continued on her magnesium infusion and  n.p.o. until we can establish that she is indeed seizure-free postdelivery.

## 2012-08-17 NOTE — Anesthesia Postprocedure Evaluation (Signed)
  Anesthesia Post-op Note  Patient: Olivia Farrell  Procedure(s) Performed: Procedure(s) (LRB) with comments: CESAREAN SECTION (N/A)  Patient Location: PACU  Anesthesia Type:Spinal  Level of Consciousness: awake, alert  and oriented  Airway and Oxygen Therapy: Patient Spontanous Breathing  Post-op Pain: none  Post-op Assessment: Post-op Vital signs reviewed, Patient's Cardiovascular Status Stable, Respiratory Function Stable, Patent Airway, No signs of Nausea or vomiting, Pain level controlled, No headache and No backache  Post-op Vital Signs: Reviewed and stable  Complications: No apparent anesthesia complications

## 2012-08-17 NOTE — Anesthesia Procedure Notes (Signed)
Spinal  Patient location during procedure: OR Start time: 08/17/2012 7:20 PM Staffing Anesthesiologist: Brayton Caves R Performed by: anesthesiologist  Preanesthetic Checklist Completed: patient identified, site marked, surgical consent, pre-op evaluation, timeout performed, IV checked, risks and benefits discussed and monitors and equipment checked Spinal Block Patient position: sitting Prep: DuraPrep Patient monitoring: heart rate, cardiac monitor, continuous pulse ox and blood pressure Approach: midline Location: L3-4 Injection technique: single-shot Needle Needle type: Sprotte  Needle gauge: 24 G Needle length: 9 cm Assessment Sensory level: T4 Additional Notes Patient identified.  Risk benefits discussed including failed block, incomplete pain control, headache, nerve damage, paralysis, blood pressure changes, nausea, vomiting, reactions to medication both toxic or allergic, and postpartum back pain.  Patient expressed understanding and wished to proceed.  All questions were answered.  Sterile technique used throughout procedure.  CSF was clear.  No parasthesia or other complications.  Please see nursing notes for vital signs.

## 2012-08-17 NOTE — Anesthesia Preprocedure Evaluation (Addendum)
Anesthesia Evaluation  Patient identified by MRN, date of birth, ID band Patient awake    Reviewed: Allergy & Precautions, H&P , Patient's Chart, lab work & pertinent test results  Airway Mallampati: II TM Distance: >3 FB Neck ROM: full    Dental No notable dental hx.    Pulmonary neg pulmonary ROS,  breath sounds clear to auscultation  Pulmonary exam normal       Cardiovascular hypertension, negative cardio ROS  Rhythm:regular Rate:Normal     Neuro/Psych negative neurological ROS  negative psych ROS   GI/Hepatic negative GI ROS, Neg liver ROS,   Endo/Other  negative endocrine ROS  Renal/GU negative Renal ROS     Musculoskeletal   Abdominal   Peds  Hematology negative hematology ROS (+)   Anesthesia Other Findings Seizure x2 this evening  Reproductive/Obstetrics (+) Pregnancy                         Anesthesia Physical Anesthesia Plan  ASA: IV and emergent  Anesthesia Plan: Spinal   Post-op Pain Management:    Induction:   Airway Management Planned:   Additional Equipment:   Intra-op Plan:   Post-operative Plan:   Informed Consent: I have reviewed the patients History and Physical, chart, labs and discussed the procedure including the risks, benefits and alternatives for the proposed anesthesia with the patient or authorized representative who has indicated his/her understanding and acceptance.     Plan Discussed with:   Anesthesia Plan Comments:      May have to provide GA if patient unable to cooperate for SAB.  Will assess on arrival to OR Anesthesia Quick Evaluation

## 2012-08-17 NOTE — Progress Notes (Signed)
UR Chart review completed.  

## 2012-08-17 NOTE — Progress Notes (Addendum)
Patient ID: Olivia Farrell, female   DOB: 1991/01/08, 21 y.o.   MRN: [id number] 21yo G1P0 at 32+ with PreE +FM, no LOF, no VB, occ ctx, some HA, no scotomata  BP 140/90, previously elevated gen NAD Abd - FNT FHT 130's, good variability toco rare, some irritability  24hr urine until 3pm Will decide POC dependent on sx's and how severe PreE Plt's stable

## 2012-08-17 NOTE — Progress Notes (Signed)
Late entry:  Called to room by family member--pt seizing.  Walked in to room, turned patient to right side and supported pt.  Applied O2, suctioned mouth, and called rapid response.  FHR obtained at that time and found to be 110's.  Dr. Senaida Ores called to bedside.  Pitocin discontinued and 2nd iv site initiated.  Decision was made to proceed with c/s and consent was obtained from pt's mother.  Pt stabilized, FHR remained stable and pt taken to OR at 1858.

## 2012-08-17 NOTE — Progress Notes (Signed)
Dr  Sheral Apley in room. IV started in left arm with 18 gauge by B.Pritchett RN, labs drawn at that time. Dr Senaida Ores called to come to room.  o2 applied, mouth suctioned.

## 2012-08-17 NOTE — Consult Note (Signed)
Neonatology Note:   Attendance at C-section:    I was asked to attend this urgent C/S at 33 weeks due to eclampsia. The mother is a G1P0 B pos, GBS pending with severe PIH and onset of seizures today. She was placed on magnesium sulfate and an induction was planned, but she then had two more seizures, treated with Midazolam. She received Betamethasone on 11/6-7 and has been on Pen G today due to unknown GBS status. This mother started [facility] at 24 weeks and dates are per ultrasound done at that time. Fetal ultrasound showed an echogenic intracardiac focus in the LV. AROM 1.5 hours prior to delivery, fluid clear. Infant vigorous with good spontaneous cry and tone. Needed only minimal bulb suctioning on the abdomen.  Ap 9/9. Lungs clear to ausc in DR. Transported to NICU in good condition with the father in attendance.   Deatra James, MD

## 2012-08-17 NOTE — Consult Note (Signed)
Neonatology Consult to Antenatal Patient:  Olivia Farrell was admitted on 11/6 at approximately 33 weeks weeks GA due PIH. She had a seizure this afternoon and is on Magnesium sulfate. Labor is now being induced. She got her second dose of BMZ this afternoon and is on IV Pen G for prophylaxis. Olivia Farrell got late [facility] beginning at 24 weeks, so dates are by ultrasound done at that time. The baby is SGA.  I spoke with the patient, the father of the baby, and her mother. We discussed what they could expect at delivery,  including usual DR management, possible respiratory complications and need for support, IV access, feedings (mother desires bottle feeding), LOS, Mortality and Morbidity, and long term outcomes. She did not have any questions at this time. I offered a NICU tour to the family members present and would be glad to come back if the patient or father  have more questions later.  Thank you for asking me to see this patient.  Deatra James, MD Neonatologist  Time spent: 20 min

## 2012-08-17 NOTE — Progress Notes (Signed)
Pt given valium 2 mg iv and magnesium sulfate 6 gm bolus started.  Family at bedside. Supporting family and updating her mom on her status. Receptive.

## 2012-08-17 NOTE — Progress Notes (Signed)
Pt sitting on couch while bed linen changed. Pt's mother screaming at patient. Patient started having seizure activity noted by B. Daphine Deutscher NT and nursing student. RN called to room. Staff from L&D entering room to assist.  Pt noted sitting on couch, seizing. Pt supported. Seizure lasted approximately 2 1/2 minutes. Patient lifted back to bed after seizure stopped by staff.

## 2012-08-17 NOTE — Transfer of Care (Signed)
Immediate Anesthesia Transfer of Care Note  Patient: Olivia Farrell  Procedure(s) Performed: Procedure(s) (LRB) with comments: CESAREAN SECTION (N/A)  Patient Location: PACU  Anesthesia Type:Spinal  Level of Consciousness: awake, alert  and oriented  Airway & Oxygen Therapy: Patient Spontanous Breathing  Post-op Assessment: Report given to PACU RN and Post -op Vital signs reviewed and stable  Post vital signs: Reviewed and stable  Complications: No apparent anesthesia complications

## 2012-08-17 NOTE — Anesthesia Preprocedure Evaluation (Signed)
Anesthesia Evaluation  Patient identified by MRN, date of birth, ID band Patient awake    Reviewed: Allergy & Precautions, H&P , NPO status , Patient's Chart, lab work & pertinent test results  Airway       Dental   Pulmonary neg pulmonary ROS,          Cardiovascular hypertension, Pt. on medications  Precclampsia   Neuro/Psych Seizures -, Poorly Controlled,  Pt. Had ecclamptic seizure negative psych ROS   GI/Hepatic negative GI ROS, Neg liver ROS,   Endo/Other  negative endocrine ROS  Renal/GU negative Renal ROS  negative genitourinary   Musculoskeletal negative musculoskeletal ROS (+)   Abdominal   Peds  Hematology negative hematology ROS (+)   Anesthesia Other Findings   Reproductive/Obstetrics (+) Pregnancy 33weeks                            Anesthesia Physical Anesthesia Plan  ASA: III and emergent  Anesthesia Plan: Spinal   Post-op Pain Management:    Induction:   Airway Management Planned: Natural Airway  Additional Equipment:   Intra-op Plan:   Post-operative Plan:   Informed Consent: I have reviewed the patients History and Physical, chart, labs and discussed the procedure including the risks, benefits and alternatives for the proposed anesthesia with the patient or authorized representative who has indicated his/her understanding and acceptance.   Dental advisory given  Plan Discussed with: CRNA, Anesthesiologist and Surgeon  Anesthesia Plan Comments:         Anesthesia Quick Evaluation

## 2012-08-17 NOTE — Progress Notes (Signed)
Patient ID: Olivia Farrell, female   DOB: [birth year], 21 y.o.   MRN: [id number] BP seems well controlled Pt reports some headache. Is collecting a 24 hour urine for protein.

## 2012-08-17 NOTE — Progress Notes (Signed)
Instructed pt to void if needed, finishing up 24 hour urine collection. Receptive.

## 2012-08-17 NOTE — Brief Op Note (Signed)
08/16/2012 - 08/17/2012  8:26 PM  PATIENT:  Olivia Farrell  21 y.o. female  PRE-OPERATIVE DIAGNOSIS:  Eclampsia; Seizures refractory to magnesium  POST-OPERATIVE DIAGNOSIS:  same  PROCEDURE:  Procedure(s) (LRB) with comments: CESAREAN SECTION (N/A) Primary LTCS  SURGEON:  Surgeon(s) and Role:    * Oliver Pila, MD - Primary   ANESTHESIA:   spinal  EBL:  Total I/O In: 80 [I.V.:80] Out: 650 [Urine:50; Blood:600]  BLOOD ADMINISTERED:none  DRAINS: Urinary Catheter (Foley)   LOCAL MEDICATIONS USED:  NONE  SPECIMEN:  Placenta to path  DISPOSITION OF SPECIMEN:  PATHOLOGY  COUNTS:  YES  TOURNIQUET:  * No tourniquets in log *  DICTATION: .Dragon Dictation  PLAN OF CARE: Admit to inpatient   PATIENT DISPOSITION:  PACU - hemodynamically stable.

## 2012-08-17 NOTE — Progress Notes (Signed)
   Subjective: Pt stable and alert.  No further seizures on the IV magnesium infusion  Objective: BP 127/57  Pulse 89  Temp 98.2 F (36.8 C) (Oral)  Resp 18  Ht 5\' 1"  (1.549 m)  Wt 59.875 kg (132 lb)  BMI 24.94 kg/m2  SpO2 97%  LMP 12/12/2011      FHT:  FHR: 120 bpm, variability: moderate,  accelerations:  Present,  decelerations:  Absent UC:   regular, every 2-3 minutes SVE:   Dilation: 1.5 Effacement (%): 50 Station: -2 Exam by:: dr. Senaida Ores AROM clear Cervix soft and mid-position Labs: Lab Results  Component Value Date   WBC 24.5* 08/17/2012   HGB 12.5 08/17/2012   HCT 36.6 08/17/2012   MCV 88.4 08/17/2012   PLT 240 08/17/2012    Assessment / Plan: Induction of labor for eclampsia  Pt on pitocin and PCN for unknown GBS status preterm Will continue magnesium  Labs and BP stable....platelets 200K+ LFT's WNL  LRICHARDSON,Olivia Farrell 08/17/2012, 6:00 PM

## 2012-08-17 NOTE — Progress Notes (Signed)
Patient ID: Olivia Farrell, female   DOB: Dec 12, 1990, 21 y.o.   MRN: [id number] CTSP for another seizure  Pt was resting comfortably and on her magnesium infusion when began to seize again Was able to break with 2mg  midazolam but took longer to recover  FHR baseline 115 and overall reassuring  D/w pt and family that we are not controlling her seizures on magnesium and since she is so remote from delivery, I recommend c-section.  We discussed risks and benefits and mother signed consent for patient. Will proceed to OR.  Pt now responding some, will try regional block if she can cooperate.

## 2012-08-17 NOTE — Progress Notes (Signed)
Pt states feels uc's but not painful.

## 2012-08-18 ENCOUNTER — Encounter (HOSPITAL_COMMUNITY): Payer: Self-pay | Admitting: Obstetrics and Gynecology

## 2012-08-18 LAB — CBC
MCH: 30.1 pg (ref 26.0–34.0)
Platelets: 208 10*3/uL (ref 150–400)
RBC: 3.62 MIL/uL — ABNORMAL LOW (ref 3.87–5.11)

## 2012-08-18 LAB — COMPREHENSIVE METABOLIC PANEL
ALT: 11 U/L (ref 0–35)
AST: 36 U/L (ref 0–37)
CO2: 23 mEq/L (ref 19–32)
Calcium: 7.2 mg/dL — ABNORMAL LOW (ref 8.4–10.5)
GFR calc non Af Amer: >90 mL/min (ref >90)
Sodium: 133 mEq/L — ABNORMAL LOW (ref 135–145)
Total Protein: 5.3 g/dL — ABNORMAL LOW (ref 6.0–8.3)

## 2012-08-18 LAB — MRSA PCR SCREENING: MRSA by PCR: NEGATIVE

## 2012-08-18 LAB — URINE CULTURE

## 2012-08-18 MED ORDER — MAGNESIUM SULFATE 40 G IN LACTATED RINGERS - SIMPLE
2.0000 g/h | INTRAVENOUS | Status: DC
  Administered 2012-08-19: 2 g/h via INTRAVENOUS
  Filled 2012-08-18 (×2): qty 500

## 2012-08-18 MED ORDER — DIAZEPAM 5 MG/ML IJ SOLN: 2.0000 mg | Freq: Once | INTRAMUSCULAR | Status: DC

## 2012-08-18 MED ORDER — MIDAZOLAM HCL 2 MG/2ML IJ SOLN: 2.0000 mg | Freq: Once | INTRAMUSCULAR | Status: DC

## 2012-08-18 MED ORDER — MAGNESIUM SULFATE 40 G IN LACTATED RINGERS - SIMPLE
2.0000 g/h | INTRAVENOUS | Status: DC
  Administered 2012-08-18: 2 g/h via INTRAVENOUS
  Filled 2012-08-18: qty 500

## 2012-08-18 NOTE — Progress Notes (Signed)
UR chart review completed.  

## 2012-08-18 NOTE — Progress Notes (Signed)
Patient ID: Olivia Farrell, female   DOB: 14-Jun-1991, 21 y.o.   MRN: [id number] BP improved Output > input.

## 2012-08-18 NOTE — Progress Notes (Signed)
Notified by lab of magnesium level = 9.0 - will notify MD

## 2012-08-18 NOTE — Anesthesia Postprocedure Evaluation (Signed)
  Anesthesia Post-op Note  Patient: Olivia Farrell  Procedure(s) Performed: Procedure(s) (LRB) with comments: CESAREAN SECTION (N/A)  Patient Location: ICU  Anesthesia Type:Epidural  Level of Consciousness: awake, alert  and oriented  Airway and Oxygen Therapy: Patient Spontanous Breathing  Post-op Pain: none  Post-op Assessment: Post-op Vital signs reviewed and Patient's Cardiovascular Status Stable  Post-op Vital Signs: Reviewed and stable  Complications: No apparent anesthesia complications

## 2012-08-18 NOTE — Addendum Note (Signed)
Addendum  created 08/18/12 0802 by Shanon Payor, CRNA   Modules edited:Notes Section

## 2012-08-18 NOTE — Progress Notes (Signed)
I visited with Olivia Farrell while making rounds on the unit.  She was in good spirits and reported that the baby was doing well.  She is coping well under the circumstances.  I provided compassionate listening.  [address] [place] Pager, [phone] 3:57 PM   08/18/12 1500  Clinical Encounter Type  Visited With Patient and family together  Visit Type Initial

## 2012-08-18 NOTE — Progress Notes (Signed)
Subjective: Postpartum Day 1 Cesarean Delivery/Eclampsia Patient reports feeling dizzy when standing.  Otherwise pain controlled and hungry. Baby stable on room air in the NICU  Objective: Vital signs in last 24 hours: Temp:  [97.4 F (36.3 C)-98.2 F (36.8 C)] 97.4 F (36.3 C) (11/08 0820) Pulse Rate:  [25-146] 94  (11/08 0820) Resp:  [13-27] 18  (11/08 0820) BP: (101-177)/(52-102) 109/52 mmHg (11/08 0800) SpO2:  [80 %-100 %] 99 % (11/08 0820) Weight:  [57.335 kg (126 lb 6.4 oz)-57.652 kg (127 lb 1.6 oz)] 57.335 kg (126 lb 6.4 oz) (11/08 0600)  Physical Exam:  General: alert and cooperative Lochia: normal Uterine Fundus: firm Incision: C/D/I    Basename 08/18/12 0450 08/17/12 1615  HGB 10.9* 12.5  HCT 30.9* 36.6    Assessment/Plan: Status post Cesarean section. Doing well postoperatively.  Continue current care. Magnesium level was 9 this AM, will hold for 40 minutes then restart at 2g/hour Allow po intake. UOP excellent  Laurel Harnden W 08/18/2012, 8:58 AM

## 2012-08-19 LAB — CBC
HCT: 30.1 % — ABNORMAL LOW (ref 36.0–46.0)
Hemoglobin: 10.4 g/dL — ABNORMAL LOW (ref 12.0–15.0)
MCH: 30.4 pg (ref 26.0–34.0)
MCHC: 34.6 g/dL (ref 30.0–36.0)

## 2012-08-19 LAB — COMPREHENSIVE METABOLIC PANEL
ALT: 14 U/L (ref 0–35)
AST: 36 U/L (ref 0–37)
CO2: 28 mEq/L (ref 19–32)
Chloride: 100 mEq/L (ref 96–112)
Creatinine, Ser: 0.81 mg/dL (ref 0.50–1.10)
GFR calc non Af Amer: >90 mL/min (ref >90)
Glucose, Bld: 87 mg/dL (ref 70–99)
Total Bilirubin: 0.1 mg/dL — ABNORMAL LOW (ref 0.3–1.2)

## 2012-08-19 LAB — MAGNESIUM: Magnesium: 7.7 mg/dL (ref 1.5–2.5)

## 2012-08-19 NOTE — Progress Notes (Signed)
Patient ID: Olivia Farrell, female   DOB: [birth year], 21 y.o.   MRN: [id number] #2 afebrile BP well controlled. Magnesium level 7.7, platelets, sgot and sgpt are normal. Output> input and she lost 1.4 pounds in the last 24 hours. She feels fine so I will stop the magnesium.

## 2012-08-20 LAB — TYPE AND SCREEN

## 2012-08-20 MED ORDER — IBUPROFEN 600 MG PO TABS: 600.0000 mg | ORAL_TABLET | Freq: Four times a day (QID) | ORAL | Status: DC | PRN

## 2012-08-20 MED ORDER — OXYCODONE-ACETAMINOPHEN 5-325 MG PO TABS: 1.0000 | ORAL_TABLET | Freq: Four times a day (QID) | ORAL | Status: DC | PRN

## 2012-08-20 NOTE — Discharge Summary (Signed)
NAMEMarland Kitchen  MONITA, SWIER NO.:  [id number]  MEDICAL RECORD NO.:  [id number]  LOCATION:  9319                          FACILITY:  [facility]  PHYSICIAN:  Malachi Pro. Ambrose Mantle, M.D. DATE OF BIRTH:  [birth year]  DATE OF ADMISSION:  08/16/2012 DATE OF DISCHARGE:  08/20/2012                              DISCHARGE SUMMARY   A 21 year old female with severe hypertension, preeclampsia admitted for 24 hour urine collection.  She also had a SGA baby 20th percentile, grade 2-3 placenta.  She started her prenatal care at 24 weeks dated by 24 week ultrasound.  Pregnancy had been complicated by poor growth of the baby.  Blood group and type B positive, negative antibody, rubella immune, RPR nonreactive, hepatitis B surface antigen negative, HIV negative, group B strep unknown.  GC and Chlamydia were negative.  GC positive for chlamydia, cystic fibrosis negative.  After admission to the hospital, the patient was placed on a 24-hour urine collection.  On the day after admission, blood pressure seemed well controlled.  The patient reported some headache and was collecting a 24-hour urine specimen.  At 4 o'clock on August 17, 2012, the patient was sitting on a couch while a bed linen was changed.  The patient's mother was screening at the patient.  The patient started having seizure activity. The RN was called in the room.  Staff from labor and delivery entered the room.  The patient was sitting on the couch seizing.  Seizure lasted approximately 2-1/2 minutes.  The patient was then placed back in her bed.  Dr. Brayton Caves from Anesthesia came into the room.  IV was started in the left arm.  Oxygen was applied and the mouth was suctioned.  The patient was given 2 mg of Valium IV and magnesium sulfate 6 g bolus was started.  The patient was then transferred to labor and delivery.  Dr. Senaida Ores came to the room.  The patient was resting in bed, slightly disoriented but responsive.  Dr.  Senaida Ores planned to start her on induction of labor since her cervix was 1 cm, 50%.  At 6 p.m., there had been no further seizures on magnesium sulfate.  The patient was placed on Pitocin at 7:02 p.m. when she began to have another seizure.  Dr. Senaida Ores discussed with the family and the patient that her seizures were not controlled on magnesium sulfate, and she recommended C-section.  She proceeded to do a C-section under spinal anesthesia with a 3 pound 13 ounce infant, Apgars of 9 and 9. Uterus appeared normal, however, tubes and ovaries did have adhesions which made them difficult to visualize as they were adherent posteriorly.  Postpartum the patient did well, had no more seizures. Her magnesium sulfate was discontinued on August 19, 2012.  She has done well since that time.  No more seizures.  Her blood pressure has been normal and on the third postop day she is ready for discharge.  LABORATORY DATA:  On admission, SGOT and PT were 36 and 11, followup of 36 and 14.  Creatinine was 0.81 on 2 occasions.  LDH was 454 postpartum. Platelet counts were 208 and 210,000.  Urine on admission showed greater than 300 mg% of  protein.  A 24-hour urine showed 3.47 g of protein. Initial hemoglobin was 10.9, followup was 10.4.  FINAL DIAGNOSES:  Intrauterine pregnancy 33+ weeks, delivered by C- section, severe preeclampsia eventually becoming eclampsia.  OPERATION:  Low transverse cervical C-section, adhesions of the tubes to the posterior aspect of the uterus.  FINAL CONDITION:  Improved.  Instructions include our regular discharge instruction booklet, Motrin 600 mg 30 tablets, 1 every 6 hours as needed for pain, Percocet 5/325, 30 tablets 1 every 6 hours as needed for pain.  The patient is to continue her prenatal vitamins and her ferrous sulfate.  She is to return to the office in 1 week.  She is also advised to take her blood pressure every day.  If the blood pressure is above  140/90, if she develops a headache be sure to call.     Malachi Pro. Ambrose Mantle, M.D.     TFH/MEDQ  D:  08/20/2012  T:  08/20/2012  Job:  [id number]

## 2012-08-20 NOTE — Progress Notes (Signed)
Discharge instructions given to patient and significant other at bedside.  No questions at this time.  Patient left unit with prescriptions and personal belongings in stable condition, accompanied by staff member.  Osvaldo Angst, RN--------

## 2012-08-20 NOTE — Progress Notes (Signed)
Patient ID: Olivia Farrell, female   DOB: 1991-06-20, 21 y.o.   MRN: [id number] #[id number] afebrile BP normal for d/c

## 2012-09-12 ENCOUNTER — Other Ambulatory Visit (HOSPITAL_COMMUNITY): Payer: Self-pay | Admitting: Obstetrics and Gynecology

## 2012-09-12 DIAGNOSIS — IMO0002 Reserved for concepts with insufficient information to code with codable children: Secondary | ICD-10-CM

## 2012-09-13 ENCOUNTER — Ambulatory Visit (HOSPITAL_COMMUNITY): Payer: Medicaid Other

## 2012-10-22 ENCOUNTER — Inpatient Hospital Stay (HOSPITAL_COMMUNITY): Admission: AD | Admit: 2012-10-22 | Payer: Medicaid Other | Source: Ambulatory Visit | Admitting: Family Medicine

## 2013-09-21 ENCOUNTER — Emergency Department (HOSPITAL_COMMUNITY)
Admission: EM | Admit: 2013-09-21 | Discharge: 2013-09-21 | Disposition: A | Discharge status: Home / Self Care | Payer: Medicaid Other | Attending: Emergency Medicine | Admitting: Emergency Medicine

## 2013-09-21 ENCOUNTER — Encounter (HOSPITAL_COMMUNITY): Payer: Self-pay | Admitting: Emergency Medicine

## 2013-09-21 DIAGNOSIS — R112 Nausea with vomiting, unspecified: Secondary | ICD-10-CM

## 2013-09-21 DIAGNOSIS — Z3202 Encounter for pregnancy test, result negative: Secondary | ICD-10-CM | POA: Insufficient documentation

## 2013-09-21 DIAGNOSIS — R197 Diarrhea, unspecified: Secondary | ICD-10-CM | POA: Insufficient documentation

## 2013-09-21 LAB — URINALYSIS, ROUTINE W REFLEX MICROSCOPIC
Glucose, UA: NEGATIVE mg/dL
Hgb urine dipstick: NEGATIVE
Protein, ur: 30 mg/dL — AB
Specific Gravity, Urine: 1.042 — ABNORMAL HIGH (ref 1.005–1.030)
pH: 6 (ref 5.0–8.0)

## 2013-09-21 LAB — URINE MICROSCOPIC-ADD ON

## 2013-09-21 LAB — CBC WITH DIFFERENTIAL/PLATELET
Basophils Absolute: 0 10*3/uL (ref 0.0–0.1)
Basophils Relative: 0 % (ref 0–1)
MCHC: 34.9 g/dL (ref 30.0–36.0)
Monocytes Absolute: 0.4 10*3/uL (ref 0.1–1.0)
Neutro Abs: 9.5 10*3/uL — ABNORMAL HIGH (ref 1.7–7.7)
Neutrophils Relative %: 94 % — ABNORMAL HIGH (ref 43–77)
RDW: 12.2 % (ref 11.5–15.5)

## 2013-09-21 LAB — PREGNANCY, URINE: Preg Test, Ur: NEGATIVE

## 2013-09-21 LAB — COMPREHENSIVE METABOLIC PANEL
AST: 19 U/L (ref 0–37)
Albumin: 4.6 g/dL (ref 3.5–5.2)
Chloride: 102 mEq/L (ref 96–112)
Creatinine, Ser: 0.63 mg/dL (ref 0.50–1.10)
Total Bilirubin: 0.9 mg/dL (ref 0.3–1.2)
Total Protein: 8.1 g/dL (ref 6.0–8.3)

## 2013-09-21 MED ORDER — ONDANSETRON HCL 4 MG/2ML IJ SOLN
4.0000 mg | Freq: Once | INTRAMUSCULAR | Status: AC
  Administered 2013-09-21: 4 mg via INTRAVENOUS
  Filled 2013-09-21: qty 2

## 2013-09-21 MED ORDER — ONDANSETRON 4 MG PO TBDP: 4.0000 mg | ORAL_TABLET | Freq: Three times a day (TID) | ORAL | Status: DC | PRN

## 2013-09-21 MED ORDER — SODIUM CHLORIDE 0.9 % IV BOLUS (SEPSIS)
1000.0000 mL | Freq: Once | INTRAVENOUS | Status: AC
  Administered 2013-09-21: 1000 mL via INTRAVENOUS

## 2013-09-21 NOTE — ED Provider Notes (Signed)
CSN: [id number]     Arrival date & time 09/21/13  1031 History   First MD Initiated Contact with Patient 09/21/13 1103     Chief Complaint  Patient presents with  . Emesis   (Consider location/radiation/quality/duration/timing/severity/associated sxs/prior Treatment) The history is provided by the patient and medical records.   This is a 22 year old female in no significant past medical history presenting to the ED for nausea, vomiting, and diarrhea, onset last night.  Patient states daughter was sick with similar symptoms 2 days ago.  Patient denies any abdominal pain, fever, sweats, or chills.  Patient states she is currently on birth control and has not had a menstrual cycle since the birth of her daughter earlier this year.  Denies any urinary sx or vaginal complaints at this time.  States she just feels very "dehydrated" and "weak" right now.  VS stable on arrival.  History reviewed. No pertinent past medical history. History reviewed. No pertinent past surgical history. History reviewed. No pertinent family history. History  Substance Use Topics  . Smoking status: Not on file  . Smokeless tobacco: Not on file  . Alcohol Use: Not on file   OB History   Grav Para Term Preterm Abortions TAB SAB Ect Mult Living   1              Review of Systems  Gastrointestinal: Positive for nausea, vomiting and diarrhea.  All other systems reviewed and are negative.    Allergies  Review of patient's allergies indicates no known allergies.  Home Medications  No current outpatient prescriptions on file. BP 113/72  Pulse 105  Temp(Src) 97.9 F (36.6 C) (Oral)  Resp 22  Ht 5\' 1"  (1.549 m)  Wt 97 lb 9.6 oz (44.271 kg)  BMI 18.45 kg/m2  SpO2 98%  Breastfeeding? Unknown  Physical Exam  Nursing note and vitals reviewed. Constitutional: She is oriented to person, place, and time. She appears well-developed and well-nourished. No distress.  HENT:  Head: Normocephalic and atraumatic.   Mouth/Throat: Oropharynx is clear and moist.  Eyes: Conjunctivae and EOM are normal. Pupils are equal, round, and reactive to light.  Neck: Normal range of motion.  Cardiovascular: Normal rate, regular rhythm and normal heart sounds.   Pulmonary/Chest: Effort normal and breath sounds normal. No respiratory distress. She has no wheezes.  Abdominal: Soft. Bowel sounds are normal. There is no tenderness. There is no guarding.  Abdomen soft, non-distended, no peritoneal signs  Musculoskeletal: Normal range of motion.  Neurological: She is alert and oriented to person, place, and time.  Skin: Skin is warm and dry. She is not diaphoretic.  Psychiatric: She has a normal mood and affect.    ED Course  Procedures (including critical care time) Labs Review Labs Reviewed  CBC WITH DIFFERENTIAL - Abnormal; Notable for the following:    Neutrophils Relative % 94 (*)    Neutro Abs 9.5 (*)    Lymphocytes Relative 3 (*)    Lymphs Abs 0.3 (*)    All other components within normal limits  COMPREHENSIVE METABOLIC PANEL - Abnormal; Notable for the following:    Glucose, Bld 124 (*)    All other components within normal limits  URINALYSIS, ROUTINE W REFLEX MICROSCOPIC - Abnormal; Notable for the following:    APPearance CLOUDY (*)    Specific Gravity, Urine 1.042 (*)    Bilirubin Urine SMALL (*)    Ketones, ur >80 (*)    Protein, ur 30 (*)    All other  components within normal limits  URINE MICROSCOPIC-ADD ON - Abnormal; Notable for the following:    Squamous Epithelial / LPF MANY (*)    Bacteria, UA FEW (*)    All other components within normal limits  PREGNANCY, URINE   Imaging Review No results found.  EKG Interpretation   None       MDM   1. Nausea & vomiting   2. Diarrhea    Labs as above, no leukocytosis or significant electrolyte abnormalities.  Urine preg negative.  Pt given IVF and zofran with good improvement of sx.  She has had no episodes of active vomiting in the ED and  has tolerated PO without difficulty.  I have low suspicion for acute/surgical abdominal pathology at this time.  Pt afebrile, non-toxic appearing, NAD, VS stable- ok for discharge.  Rx zofran-- advised to drink plenty of fluids, start with bland diet and progress as tolerated.  FU with PCP if problems occur.  Discussed plan with pt, she agreed.  Return precautions advised.    Garlon Hatchet, PA-C 09/21/13 1504

## 2013-09-21 NOTE — ED Notes (Signed)
Pt just feels weakness from the vomiting and diarrhea/ some abdominal pain upon palpation of lower abdomen

## 2013-09-21 NOTE — ED Notes (Signed)
Pt c/o N/V/D starting last night; pt sts on birth control and has not had a period since her daughter was born

## 2013-09-21 NOTE — ED Provider Notes (Signed)
Medical screening examination/treatment/procedure(s) were performed by non-physician practitioner and as supervising physician I was immediately available for consultation/collaboration.  EKG Interpretation   None         Joya Gaskins, MD 09/21/13 [phone]

## 2013-12-10 IMAGING — US US OB DETAIL+14 WK
1 series · 16 of 28 positions shown · non-contrast
Comparison: none

[Series 1: us ob detail+14 wk · 0.21mm/px · 16 of 64 slices shown]
[im 1/64]
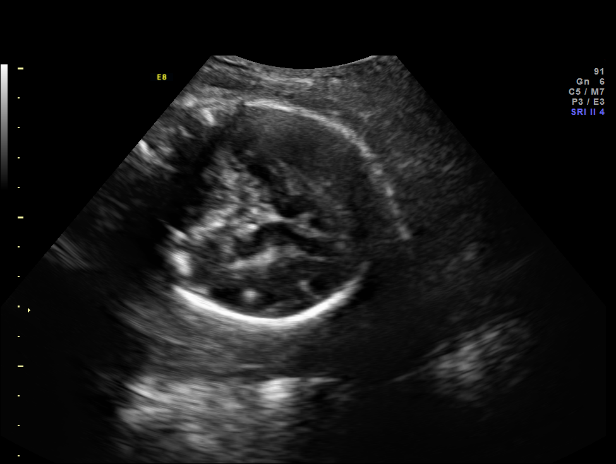
[im 5/64]
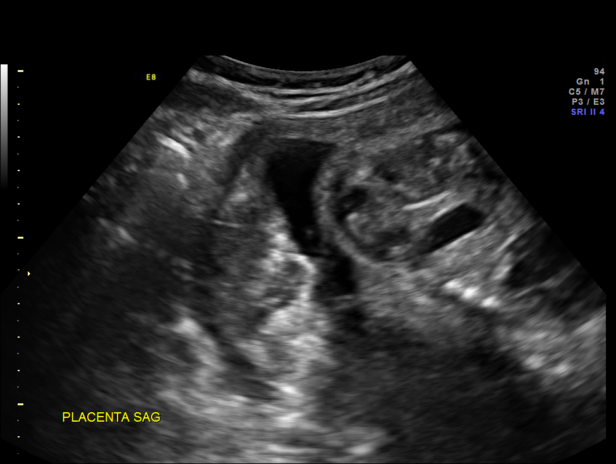
[im 10/64]
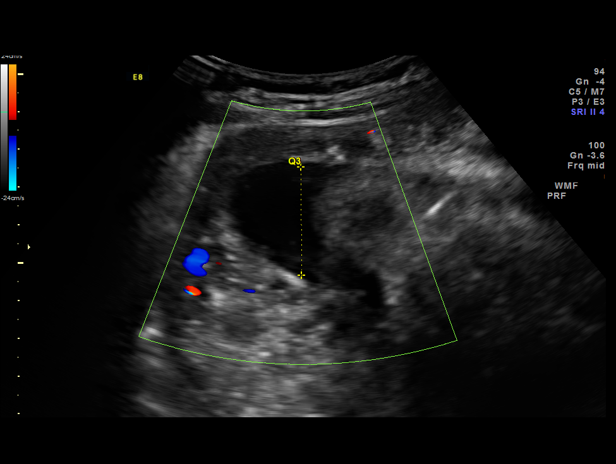
[im 15/64]
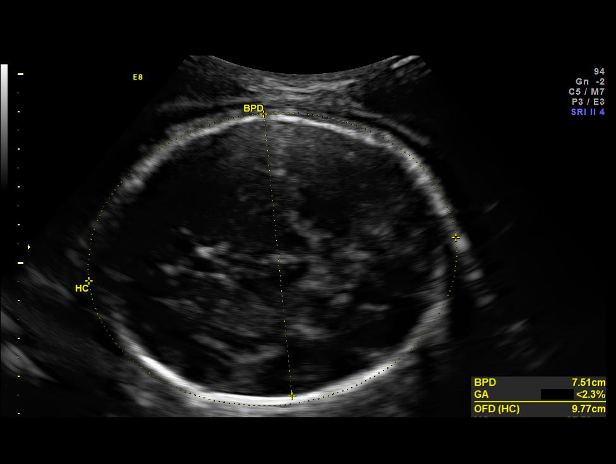
[im 17/64]
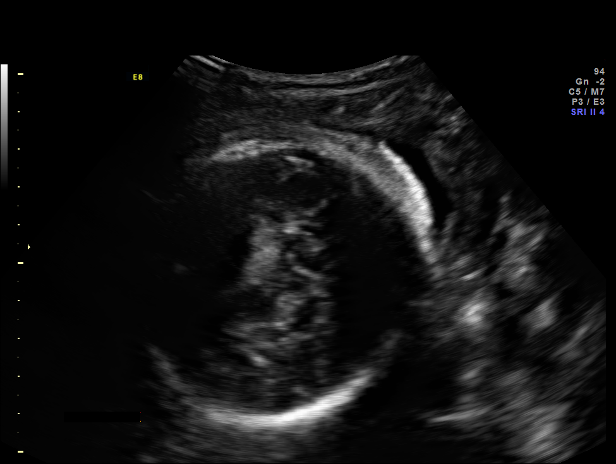
[im 22/64]
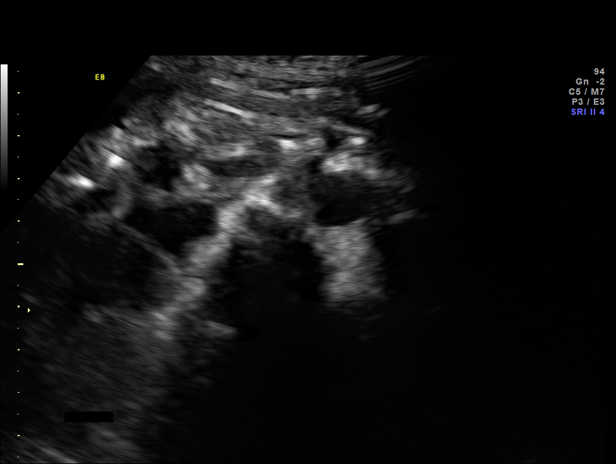
[im 26/64]
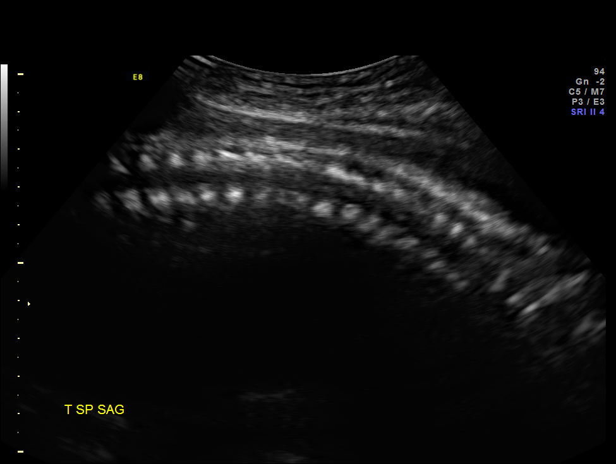
[im 31/64]
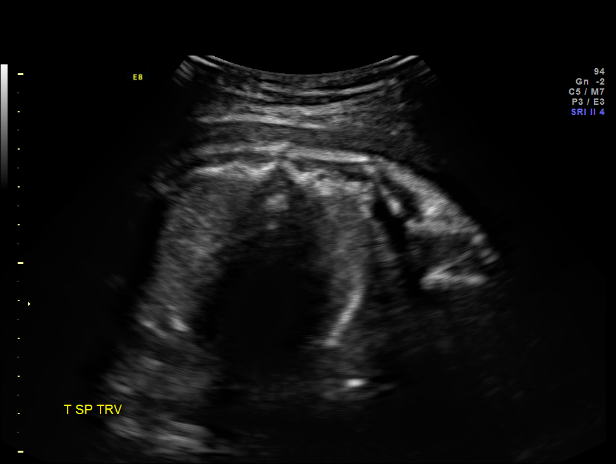
[im 33/64]
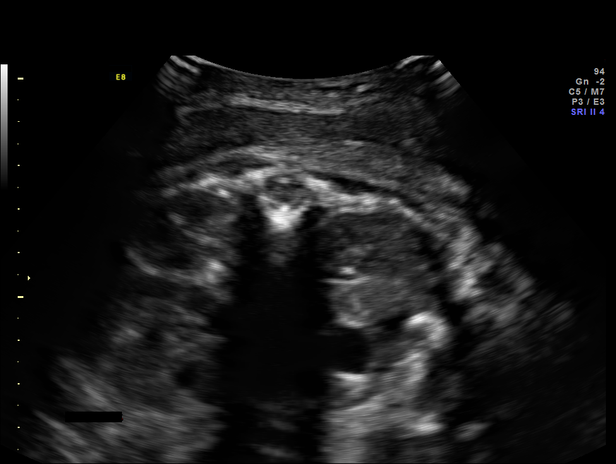
[im 38/64]
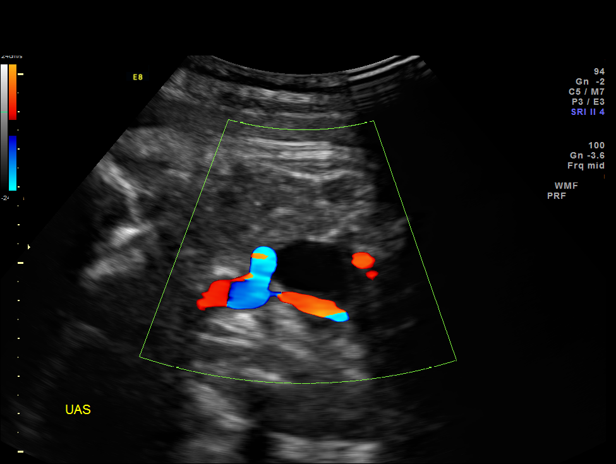
[im 43/64]
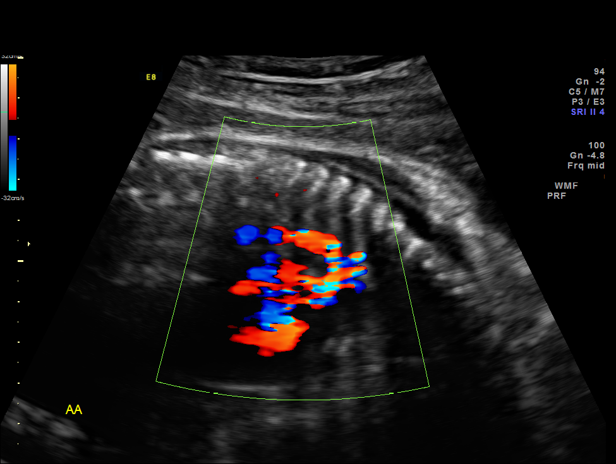
[im 47/64]
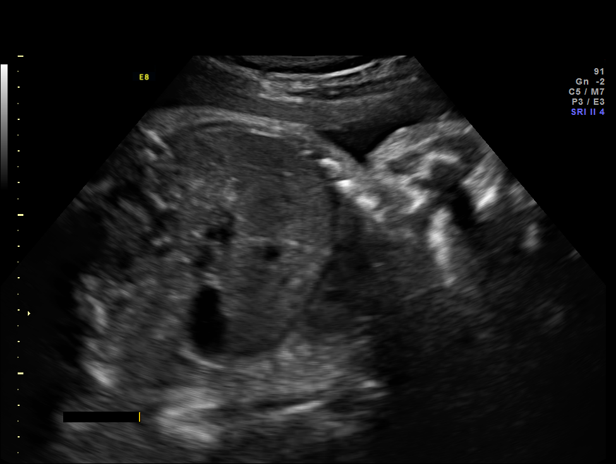
[im 50/64]
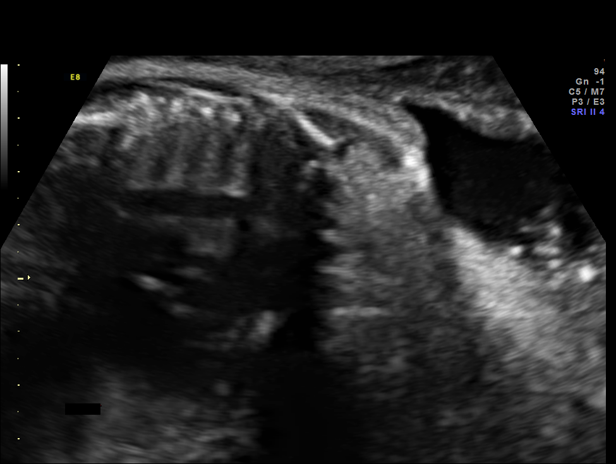
[im 54/64]
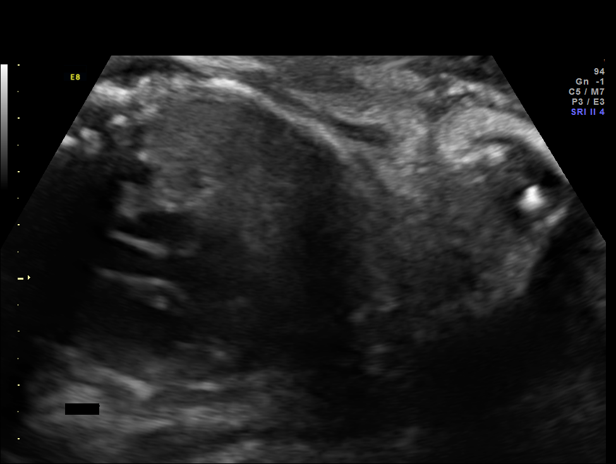
[im 59/64]
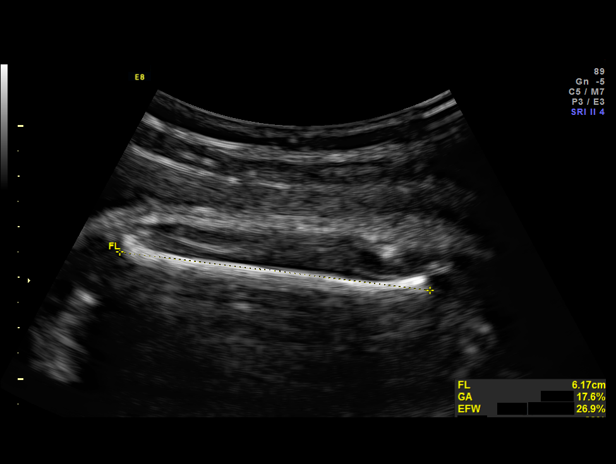
[im 64/64]
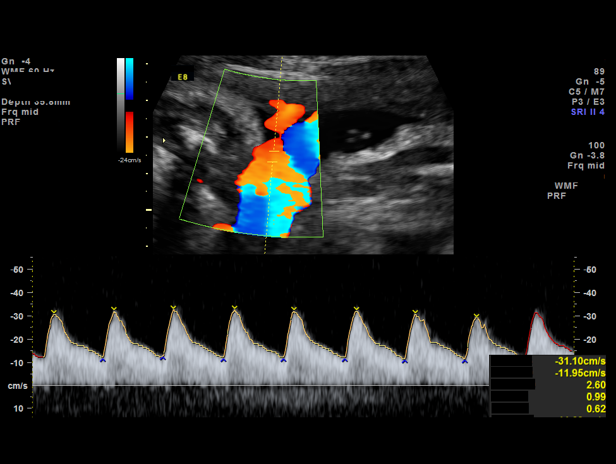

[16 of 28 positions shown; findings below may reference images not displayed]

OBSTETRICS REPORT
                      (Signed Final 08/16/2012 [DATE])

Service(s) Provided

 US OB DETAIL + 14 WK                                  76811.0
Indications

 Detailed fetal anatomic survey
 Intrauterine Growth Restriction
 Echogenic intracardiac focus of the heart  (EIF)
Fetal Evaluation

 Num Of Fetuses:    1
 Fetal Heart Rate:  139                          bpm
 Cardiac Activity:  Observed
 Presentation:      Cephalic
 Placenta:          Posterior

 Amniotic Fluid
 AFI FV:      Subjectively low-normal
 AFI Sum:     9.61    cm       15  %Tile     Larg Pckt:    2.87  cm
 RUQ:   2.45    cm   RLQ:    2.47   cm    LUQ:   1.82    cm   LLQ:    2.87   cm
Biometry

 BPD:     74.9  mm     G. Age:  30w 0d                CI:         76.4   70 - 86
 OFD:       98  mm                                    FL/HC:      22.4   19.9 -

 HC:       276  mm     G. Age:  30w 1d      < 3  %    HC/AC:      1.02   0.96 -

 AC:     271.2  mm     G. Age:  31w 2d       10  %    FL/BPD:     82.5   71 - 87
 FL:      61.8  mm     G. Age:  32w 0d       19  %    FL/AC:      22.8   20 - 24
 HUM:     55.7  mm     G. Age:  32w 3d       48  %
 CER:     41.2  mm     G. Age:  35w 1d       77  %

 Est. FW:    9549  gm    3 lb 13 oz      28  %
Gestational Age

 LMP:           35w 3d        Date:  12/12/11                 EDD:   09/17/12
 U/S Today:     30w 6d                                        EDD:   10/19/12
 Best:          32w 6d     Det. By:  Early Ultrasound         EDD:   10/05/12
                                     (06/15/12)
Anatomy
 Cranium:          Appears normal         Aortic Arch:      Appears normal
 Fetal Cavum:      Appears normal         Ductal Arch:      Appears normal
 Ventricles:       Appears normal         Diaphragm:        Appears normal
 Choroid Plexus:   Appears normal         Stomach:          Appears normal
 Cerebellum:       Appears normal         Abdomen:          Appears normal
 Posterior Fossa:  Appears normal         Abdominal Wall:   Not well visualized
 Nuchal Fold:      Not applicable (>20    Cord Vessels:     Appears normal (3
                   wks GA)                                  vessel cord)
 Face:             Orbits appear          Kidneys:          Appear normal
                   normal
 Lips:             Not well visualized    Bladder:          Appears normal
 Heart:            Echogenic focus        Spine:            Appears normal
                   in LV
 RVOT:             Appears normal         Lower             Appears normal
                                          Extremities:
 LVOT:             Not well visualized    Upper             Appears normal
                                          Extremities:

 Other:  Technically difficult due to advanced GA and fetal position.
Targeted Anatomy

 Fetal Central Nervous System
 Cisterna Magna:
Cervix Uterus Adnexa

 Cervix:       Not visualized (advanced GA >52wks)

 Adnexa:     No abnormality visualized.
Comments

 An active singleton fetus is observed.
 Biometry is appropriate for gestational age, noting overall
 symmetric fetal growth pattern (HC/AC 1.02) and EFW at the
 28th percentile.
 AC is at the 10th percentile.
 Amniotic fluid volume is normal.
 Screening survey of the fetal anatomy was performed and no
 structural defects are detected but there is an echogenic
 intracardiac focus in the left ventricle.

 Today's discussion was limited to pertinent ultrasound
 findings.  In addition, there is an echogenic intracardiac focus
 of the left ventricle. I demonstrated the echogenic
 intracardiac focus (EIF) on the ultrasound screen.  This
 finding has been implicated as a marker for increased risk of
 aneuploidy, especially Trisomy 21. However, it is also seen
 as a normal variant in 2-4% of all pregnancy ultrasound
 exams.  Although its significance as an isolated finding is not
 entirely clear, a consensus opinion would be that an isolated
 EIF (with no other risk factors or suspicious ultrasound
 findings) does not significantly increase risk of aneuploidy.
 With her relatively young age and absence of other
 sonographic findings,her risk for chromosome abnormality is
 not increased to the point that amniocentesis is
 recommended. Therefore, I did not specifically mention this to
 your patient as for her this finding is essentially a "normal
 variant" and does not change her risk stratification as she
 remains "low risk."  Furthermore, echogenic focus is not a
 "heart defect" and no special fetal surveillance or neonatal
 cardiac evaluation is recommended.

 Moreover, the patient was noted to have BP's of 159/98 and
 148/109, prompting my call to Dr. [REDACTED] to notify
 them that the patient was being sent to the [facility] for
 evaluation for possible preeclampsia.
Impression

 Active singleton fetus
 EIF
 EFW at 28th percentile with AC at the 10th percentile
 Severe hypertension, needs evaluation for preeclampsia
Recommendations

 1. The patient was sent to [facility] for evaluation of possible
 preeclampsia.
 2. Due to borderline AC measurement, I would consider
 reassessing interval growth in 3-4 weeks by ultrasound,
 provided no clinical indication for delivery is found in the
 interim.
 3. Follow up as clinically indicated.

## 2014-06-07 ENCOUNTER — Encounter (HOSPITAL_COMMUNITY): Payer: Self-pay | Admitting: Emergency Medicine

## 2014-06-07 ENCOUNTER — Emergency Department (HOSPITAL_COMMUNITY)
Admission: EM | Admit: 2014-06-07 | Discharge: 2014-06-07 | Disposition: A | Discharge status: Home / Self Care | Payer: Medicaid Other | Attending: Emergency Medicine | Admitting: Emergency Medicine

## 2014-06-07 DIAGNOSIS — R300 Dysuria: Secondary | ICD-10-CM | POA: Insufficient documentation

## 2014-06-07 DIAGNOSIS — N3001 Acute cystitis with hematuria: Secondary | ICD-10-CM

## 2014-06-07 DIAGNOSIS — N3000 Acute cystitis without hematuria: Secondary | ICD-10-CM | POA: Insufficient documentation

## 2014-06-07 DIAGNOSIS — Z3202 Encounter for pregnancy test, result negative: Secondary | ICD-10-CM | POA: Insufficient documentation

## 2014-06-07 DIAGNOSIS — Z87440 Personal history of urinary (tract) infections: Secondary | ICD-10-CM | POA: Insufficient documentation

## 2014-06-07 DIAGNOSIS — R319 Hematuria, unspecified: Secondary | ICD-10-CM | POA: Insufficient documentation

## 2014-06-07 LAB — URINALYSIS, ROUTINE W REFLEX MICROSCOPIC
Bilirubin Urine: NEGATIVE
Glucose, UA: NEGATIVE mg/dL
Ketones, ur: 40 mg/dL — AB
Nitrite: POSITIVE — AB
Protein, ur: >300 mg/dL — AB
SPECIFIC GRAVITY, URINE: 1.025 (ref 1.005–1.030)
Urobilinogen, UA: 1 mg/dL (ref 0.0–1.0)
pH: 6 (ref 5.0–8.0)

## 2014-06-07 LAB — URINE MICROSCOPIC-ADD ON

## 2014-06-07 LAB — PREGNANCY, URINE: Preg Test, Ur: NEGATIVE

## 2014-06-07 MED ORDER — SULFAMETHOXAZOLE-TRIMETHOPRIM 800-160 MG PO TABS: 1.0000 | ORAL_TABLET | Freq: Two times a day (BID) | ORAL | Status: DC

## 2014-06-07 NOTE — ED Provider Notes (Signed)
CSN: [id number]     Arrival date & time 06/07/14  1927 History  This chart was scribed for non-physician practitioner working with Gilda Crease, * by Elveria Rising, ED Scribe. This patient was seen in room TR10C/TR10C and the patient's care was started at 10:41 PM.   Chief Complaint  Patient presents with  . Dysuria  . Urinary Frequency    The history is provided by the patient. No language interpreter was used.   HPI Comments: Olivia Farrell is a 23 y.o. female who presents to the Emergency Department complaining of dysuria, onset this week. Patient's additional urinary symptoms include frequency and hematuria. Patient denies treatment at home. Patient denies vaginal discharge, flank pain, abdominal pain, nausea or vomiting.  Patient reports history of UTI when she was younger, but none recently.   History reviewed. No pertinent past medical history. Past Surgical History  Procedure Laterality Date  . Cesarean section     No family history on file. History  Substance Use Topics  . Smoking status: Never Smoker   . Smokeless tobacco: Not on file  . Alcohol Use: No   OB History   Grav Para Term Preterm Abortions TAB SAB Ect Mult Living   1              Review of Systems  Constitutional: Negative for fever and chills.  Gastrointestinal: Negative for nausea, vomiting and abdominal pain.  Genitourinary: Positive for dysuria, urgency, frequency and hematuria. Negative for vaginal discharge.  All other systems reviewed and are negative.   Allergies  Review of patient's allergies indicates no known allergies.  Home Medications   Prior to Admission medications   Not on File   Triage Vitals: BP 113/67  Pulse 64  Temp(Src) 98.6 F (37 C) (Oral)  Resp 16  Ht  (1.549 m)  Wt 101 lb (45.813 kg)  BMI 19.09 kg/m2  SpO2 100%  Physical Exam  Nursing note and vitals reviewed. Constitutional: She is oriented to person, place, and time. She appears well-developed  and well-nourished. No distress.  HENT:  Head: Normocephalic and atraumatic.  Eyes: EOM are normal.  Neck: Neck supple.  Cardiovascular: Normal rate, regular rhythm and normal heart sounds.   Pulmonary/Chest: Effort normal and breath sounds normal.  Abdominal: Soft. There is no tenderness.  No abdominal pain or CVA tenderness.   Musculoskeletal: Normal range of motion.  Neurological: She is alert and oriented to person, place, and time.  Skin: Skin is warm and dry.  Psychiatric: She has a normal mood and affect. Her behavior is normal.    ED Course  Procedures (including critical care time)  COORDINATION OF CARE: 10:44 PM- Will prescribe antibiotics. Patient advised to follow up with PCP after finishing course to ensure the infection has cleared. Discussed treatment plan with patient at bedside and patient agreed to plan.   Labs Review Labs Reviewed  URINALYSIS, ROUTINE W REFLEX MICROSCOPIC - Abnormal; Notable for the following:    Color, Urine RED (*)    APPearance TURBID (*)    Hgb urine dipstick LARGE (*)    Ketones, ur 40 (*)    Protein, ur >300 (*)    Nitrite POSITIVE (*)    Leukocytes, UA LARGE (*)    All other components within normal limits  URINE MICROSCOPIC-ADD ON - Abnormal; Notable for the following:    Squamous Epithelial / LPF FEW (*)    Bacteria, UA MANY (*)    All other components within normal limits  PREGNANCY, URINE    Imaging Review No results found.   EKG Interpretation None     No fever, chills, flank pain, or CVA tenderness.  UA consistent with cystitis. MDM   Final diagnoses:  None    UTI.  Will treat with bactrim.  Follow-up with PCP after completion of antibiotics to insure infection has cleared.  I personally performed the services described in this documentation, which was scribed in my presence. The recorded information has been reviewed and is accurate.    Jimmye Norman, NP 06/08/14 [phone]

## 2014-06-07 NOTE — ED Notes (Signed)
Pt. reports urinary frequency , dysuria and hematuria onset this week , denies fever or chills.

## 2014-06-07 NOTE — Discharge Instructions (Signed)

## 2014-06-08 NOTE — ED Provider Notes (Signed)
Medical screening examination/treatment/procedure(s) were performed by non-physician practitioner and as supervising physician I was immediately available for consultation/collaboration.  Christopher J. Pollina, MD 06/08/14 1845 

## 2014-08-12 ENCOUNTER — Encounter (HOSPITAL_COMMUNITY): Payer: Self-pay | Admitting: Emergency Medicine

## 2014-08-12 ENCOUNTER — Encounter (HOSPITAL_COMMUNITY): Payer: Self-pay | Admitting: Obstetrics and Gynecology

## 2016-02-20 ENCOUNTER — Emergency Department (HOSPITAL_COMMUNITY): Payer: 59

## 2016-02-20 ENCOUNTER — Emergency Department (HOSPITAL_COMMUNITY)
Admission: EM | Admit: 2016-02-20 | Discharge: 2016-02-21 | Disposition: A | Discharge status: Home / Self Care | Payer: 59 | Attending: Emergency Medicine | Admitting: Emergency Medicine

## 2016-02-20 ENCOUNTER — Encounter (HOSPITAL_COMMUNITY): Payer: Self-pay | Admitting: *Deleted

## 2016-02-20 DIAGNOSIS — R05 Cough: Secondary | ICD-10-CM | POA: Diagnosis present

## 2016-02-20 DIAGNOSIS — J4520 Mild intermittent asthma, uncomplicated: Secondary | ICD-10-CM | POA: Insufficient documentation

## 2016-02-20 DIAGNOSIS — Z792 Long term (current) use of antibiotics: Secondary | ICD-10-CM | POA: Insufficient documentation

## 2016-02-20 DIAGNOSIS — J069 Acute upper respiratory infection, unspecified: Secondary | ICD-10-CM | POA: Insufficient documentation

## 2016-02-20 LAB — URINALYSIS, ROUTINE W REFLEX MICROSCOPIC
Bilirubin Urine: NEGATIVE
Glucose, UA: NEGATIVE mg/dL
Hgb urine dipstick: NEGATIVE
Ketones, ur: NEGATIVE mg/dL
Leukocytes, UA: NEGATIVE
Nitrite: NEGATIVE
Protein, ur: NEGATIVE mg/dL
Specific Gravity, Urine: 1.007 (ref 1.005–1.030)
pH: 6.5 (ref 5.0–8.0)

## 2016-02-20 MED ORDER — ALBUTEROL SULFATE HFA 108 (90 BASE) MCG/ACT IN AERS
2.0000 | INHALATION_SPRAY | Freq: Once | RESPIRATORY_TRACT | Status: AC
  Administered 2016-02-21: 2 via RESPIRATORY_TRACT
  Filled 2016-02-20: qty 6.7

## 2016-02-20 MED ORDER — AEROCHAMBER PLUS W/MASK MISC
1.0000 | Freq: Once | Status: AC
  Administered 2016-02-21: 1
  Filled 2016-02-20: qty 1

## 2016-02-20 NOTE — ED Notes (Signed)
The pt is c/o being ill for one week   For the past 2 days she has had weakness generalized body aches cough non-productive  Low grade temp lmp none birth control

## 2016-02-20 NOTE — ED Provider Notes (Signed)
CSN: [id number]     Arrival date & time 02/20/16  2104 History   First MD Initiated Contact with Patient 02/20/16 2337     Chief Complaint  Patient presents with  . Generalized Body Aches     (Consider location/radiation/quality/duration/timing/severity/associated sxs/prior Treatment) HPI  Olivia Farrell is a(n) 25 y.o. female who presents to the ED with cc of cough, congestion. Patient states her symptoms began one week ago. She has associated malaise and myalgias. She's been using TheraFlu with significant relief. While the medication is on board. She states when it wears off, her symptoms returned. She complains of cough which is worse at night and wakes her from sleep. She denies wheezing or history of reactive airway. She denies sore throat, abdominal pain, urinary symptoms. History reviewed. No pertinent past medical history. Past Surgical History  Procedure Laterality Date  . Cesarean section     No family history on file. Social History  Substance Use Topics  . Smoking status: Never Smoker   . Smokeless tobacco: None  . Alcohol Use: No   OB History    Gravida Para Term Preterm AB TAB SAB Ectopic Multiple Living   1              Review of Systems  Ten systems reviewed and are negative for acute change, except as noted in the HPI.    Allergies  Review of patient's allergies indicates no known allergies.  Home Medications   Prior to Admission medications   Medication Sig Start Date End Date Taking? Authorizing Provider  sulfamethoxazole-trimethoprim (SEPTRA DS) 800-160 MG per tablet Take 1 tablet by mouth 2 (two) times daily. 06/07/14   Felicie Morn, NP   BP 130/92 mmHg  Pulse 107  Temp(Src) 98.4 F (36.9 C) (Oral)  Resp 16  Wt 45.813 kg  SpO2 99% Physical Exam  Constitutional: She is oriented to person, place, and time. She appears well-developed and well-nourished. No distress.  HENT:  Head: Normocephalic and atraumatic.  Cobblestoning on post pharynx.   Eyes: Conjunctivae are normal. No scleral icterus.  Neck: Normal range of motion.  Cardiovascular: Normal rate, regular rhythm and normal heart sounds.  Exam reveals no gallop and no friction rub.   No murmur heard. Pulmonary/Chest: Effort normal and breath sounds normal. No respiratory distress.  Abdominal: Soft. Bowel sounds are normal. She exhibits no distension and no mass. There is no tenderness. There is no guarding.  Neurological: She is alert and oriented to person, place, and time.  Skin: Skin is warm and dry. She is not diaphoretic.  Nursing note and vitals reviewed.   ED Course  Procedures (including critical care time) Labs Review Labs Reviewed  URINALYSIS, ROUTINE W REFLEX MICROSCOPIC (NOT AT [facility])  POC URINE PREG, ED    Imaging Review Dg Chest 2 View  02/20/2016  CLINICAL DATA:  Weakness, nonproductive cough and low-grade fever for 1 week. EXAM: CHEST  2 VIEW COMPARISON:  06/25/2009 FINDINGS: The lungs are clear. The pulmonary vasculature is normal. Heart size is normal. Hilar and mediastinal contours are unremarkable. There is no pleural effusion. IMPRESSION: No active cardiopulmonary disease. Electronically Signed   By: Ellery Plunk M.D.   On: 02/20/2016 22:06   I have personally reviewed and evaluated these images and lab results as part of my medical decision-making.   EKG Interpretation None      MDM   Final diagnoses:  None    11:58 PM Patient cxr and ua negative. Appears to have viral  URI with RAD.  Pt CXR negative for acute infiltrate. Patients symptoms are consistent with URI, likely viral etiology. Discussed that antibiotics are not indicated for viral infections. Pt will be discharged with symptomatic treatment.  Verbalizes understanding and is agreeable with plan. Pt is hemodynamically stable & in NAD prior to dc.     Arthor Captainbigail Lajarvis Italiano, PA-C 02/21/16 [id number]  Gerhard Munchobert Lockwood, MD 02/21/16 [phone]

## 2016-02-20 NOTE — ED Notes (Signed)
Pt texting on cell phone while being triaged.  No distress

## 2016-02-20 NOTE — ED Notes (Signed)
Pt on droplet   A mask  Cough low grade temp

## 2016-02-20 NOTE — ED Notes (Signed)
She has not taken any tylenol or advil

## 2016-02-21 MED ORDER — PREDNISONE 20 MG PO TABS: 40.0000 mg | ORAL_TABLET | Freq: Every day | ORAL | Status: DC

## 2016-02-21 MED ORDER — BENZONATATE 100 MG PO CAPS: 200.0000 mg | ORAL_CAPSULE | Freq: Two times a day (BID) | ORAL | Status: DC | PRN

## 2016-02-21 NOTE — Discharge Instructions (Signed)
Viral Infections A viral infection can be caused by different types of viruses.Most viral infections are not serious and resolve on their own. However, some infections may cause severe symptoms and may lead to further complications. SYMPTOMS Viruses can frequently cause:  Minor sore throat.  Aches and pains.  Headaches.  Runny nose.  Different types of rashes.  Watery eyes.  Tiredness.  Cough.  Loss of appetite.  Gastrointestinal infections, resulting in nausea, vomiting, and diarrhea. These symptoms do not respond to antibiotics because the infection is not caused by bacteria. However, you might catch a bacterial infection following the viral infection. This is sometimes called a "superinfection." Symptoms of such a bacterial infection may include:  Worsening sore throat with pus and difficulty swallowing.  Swollen neck glands.  Chills and a high or persistent fever.  Severe headache.  Tenderness over the sinuses.  Persistent overall ill feeling (malaise), muscle aches, and tiredness (fatigue).  Persistent cough.  Yellow, green, or brown mucus production with coughing. HOME CARE INSTRUCTIONS   Only take over-the-counter or prescription medicines for pain, discomfort, diarrhea, or fever as directed by your caregiver.  Drink enough water and fluids to keep your urine clear or pale yellow. Sports drinks can provide valuable electrolytes, sugars, and hydration.  Get plenty of rest and maintain proper nutrition. Soups and broths with crackers or rice are fine. SEEK IMMEDIATE MEDICAL CARE IF:   You have severe headaches, shortness of breath, chest pain, neck pain, or an unusual rash.  You have uncontrolled vomiting, diarrhea, or you are unable to keep down fluids.  You or your child has an oral temperature above 102 F (38.9 C), not controlled by medicine.  Your baby is older than 3 months with a rectal temperature of 102 F (38.9 C) or higher.  Your baby is 21  months old or younger with a rectal temperature of 100.4 F (38 C) or higher. MAKE SURE YOU:   Understand these instructions.  Will watch your condition.  Will get help right away if you are not doing well or get worse.   This information is not intended to replace advice given to you by your health care provider. Make sure you discuss any questions you have with your health care provider.   Document Released: 07/07/2005 Document Revised: 12/20/2011 Document Reviewed: 03/05/2015 Elsevier Interactive Patient Education 2016 Elsevier Inc.  Asthma, Adult Asthma is a recurring condition in which the airways tighten and narrow. Asthma can make it difficult to breathe. It can cause coughing, wheezing, and shortness of breath. Asthma episodes, also called asthma attacks, range from minor to life-threatening. Asthma cannot be cured, but medicines and lifestyle changes can help control it. CAUSES Asthma is believed to be caused by inherited (genetic) and environmental factors, but its exact cause is unknown. Asthma may be triggered by allergens, lung infections, or irritants in the air. Asthma triggers are different for each person. Common triggers include:   Animal dander.  Dust mites.  Cockroaches.  Pollen from trees or grass.  Mold.  Smoke.  Air pollutants such as dust, household cleaners, hair sprays, aerosol sprays, paint fumes, strong chemicals, or strong odors.  Cold air, weather changes, and winds (which increase molds and pollens in the air).  Strong emotional expressions such as crying or laughing hard.  Stress.  Certain medicines (such as aspirin) or types of drugs (such as beta-blockers).  Sulfites in foods and drinks. Foods and drinks that may contain sulfites include dried fruit, potato chips, and sparkling  grape juice.  Infections or inflammatory conditions such as the flu, a cold, or an inflammation of the nasal membranes (rhinitis).  Gastroesophageal reflux  disease (GERD).  Exercise or strenuous activity. SYMPTOMS Symptoms may occur immediately after asthma is triggered or many hours later. Symptoms include:  Wheezing.  Excessive nighttime or early morning coughing.  Frequent or severe coughing with a common cold.  Chest tightness.  Shortness of breath. DIAGNOSIS  The diagnosis of asthma is made by a review of your medical history and a physical exam. Tests may also be performed. These may include:  Lung function studies. These tests show how much air you breathe in and out.  Allergy tests.  Imaging tests such as X-rays. TREATMENT  Asthma cannot be cured, but it can usually be controlled. Treatment involves identifying and avoiding your asthma triggers. It also involves medicines. There are 2 classes of medicine used for asthma treatment:   Controller medicines. These prevent asthma symptoms from occurring. They are usually taken every day.  Reliever or rescue medicines. These quickly relieve asthma symptoms. They are used as needed and provide short-term relief. Your health care provider will help you create an asthma action plan. An asthma action plan is a written plan for managing and treating your asthma attacks. It includes a list of your asthma triggers and how they may be avoided. It also includes information on when medicines should be taken and when their dosage should be changed. An action plan may also involve the use of a device called a peak flow meter. A peak flow meter measures how well the lungs are working. It helps you monitor your condition. HOME CARE INSTRUCTIONS   Take medicines only as directed by your health care provider. Speak with your health care provider if you have questions about how or when to take the medicines.  Use a peak flow meter as directed by your health care provider. Record and keep track of readings.  Understand and use the action plan to help minimize or stop an asthma attack without needing  to seek medical care.  Control your home environment in the following ways to help prevent asthma attacks:  Do not smoke. Avoid being exposed to secondhand smoke.  Change your heating and air conditioning filter regularly.  Limit your use of fireplaces and wood stoves.  Get rid of pests (such as roaches and mice) and their droppings.  Throw away plants if you see mold on them.  Clean your floors and dust regularly. Use unscented cleaning products.  Try to have someone else vacuum for you regularly. Stay out of rooms while they are being vacuumed and for a short while afterward. If you vacuum, use a dust mask from a hardware store, a double-layered or microfilter vacuum cleaner bag, or a vacuum cleaner with a HEPA filter.  Replace carpet with wood, tile, or vinyl flooring. Carpet can trap dander and dust.  Use allergy-proof pillows, mattress covers, and box spring covers.  Wash bed sheets and blankets every week in hot water and dry them in a dryer.  Use blankets that are made of polyester or cotton.  Clean bathrooms and kitchens with bleach. If possible, have someone repaint the walls in these rooms with mold-resistant paint. Keep out of the rooms that are being cleaned and painted.  Wash hands frequently. SEEK MEDICAL CARE IF:   You have wheezing, shortness of breath, or a cough even if taking medicine to prevent attacks.  The colored mucus you cough up (  sputum) is thicker than usual.  Your sputum changes from clear or white to yellow, green, gray, or bloody.  You have any problems that may be related to the medicines you are taking (such as a rash, itching, swelling, or trouble breathing).  You are using a reliever medicine more than 2-3 times per week.  Your peak flow is still at 50-79% of your personal best after following your action plan for 1 hour.  You have a fever. SEEK IMMEDIATE MEDICAL CARE IF:   You seem to be getting worse and are unresponsive to treatment  during an asthma attack.  You are short of breath even at rest.  You get short of breath when doing very little physical activity.  You have difficulty eating, drinking, or talking due to asthma symptoms.  You develop chest pain.  You develop a fast heartbeat.  You have a bluish color to your lips or fingernails.  You are light-headed, dizzy, or faint.  Your peak flow is less than 50% of your personal best.   This information is not intended to replace advice given to you by your health care provider. Make sure you discuss any questions you have with your health care provider.   Document Released: 09/27/2005 Document Revised: 06/18/2015 Document Reviewed: 04/26/2013 Elsevier Interactive Patient Education [employer]2016 Elsevier Inc.

## 2016-06-30 ENCOUNTER — Encounter (HOSPITAL_COMMUNITY): Payer: Self-pay | Admitting: *Deleted

## 2017-06-15 IMAGING — CR DG CHEST 2V
2 series · 2 of 2 positions shown · non-contrast
Comparison: 06/25/2009

CLINICAL DATA: Weakness, nonproductive cough and low-grade fever
for 1 week.

EXAM:
CHEST  2 VIEW

[chest pa]
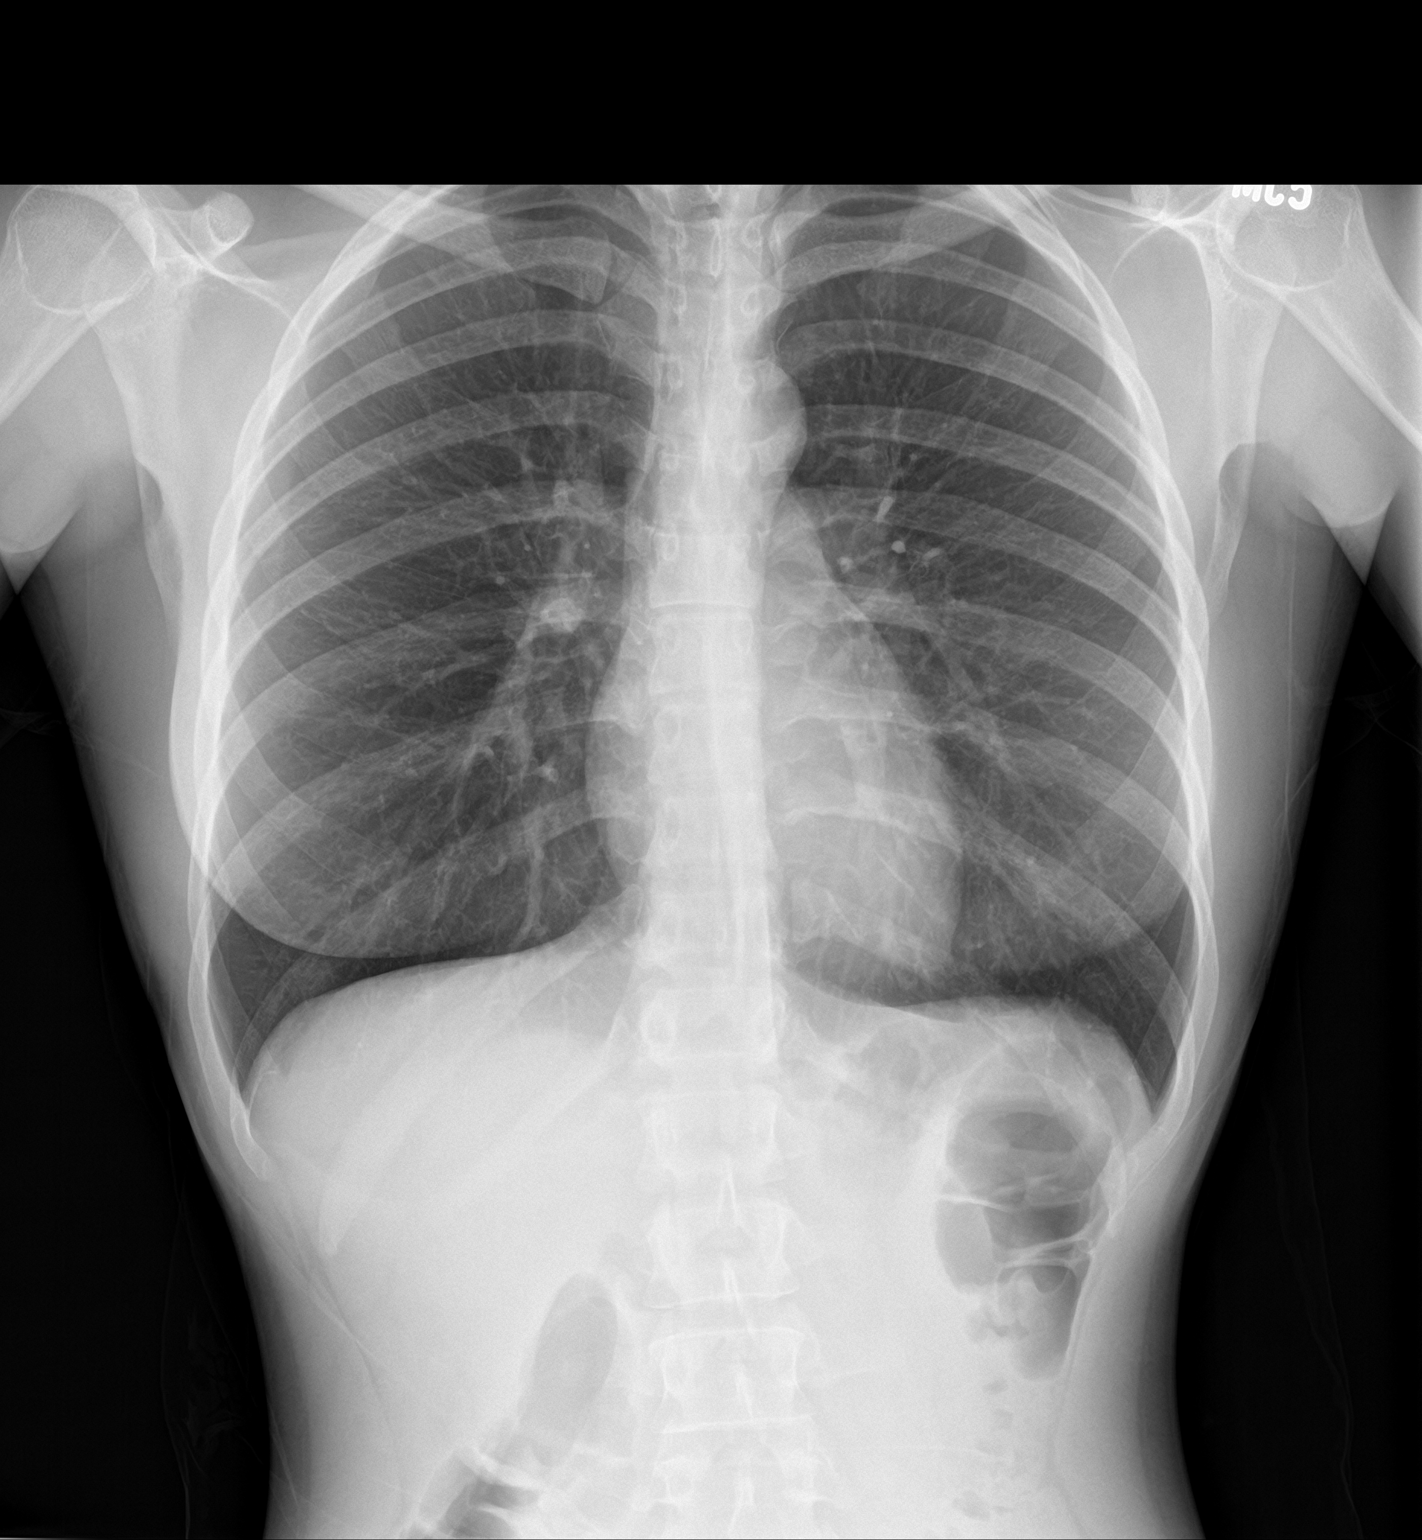

[chest lat]
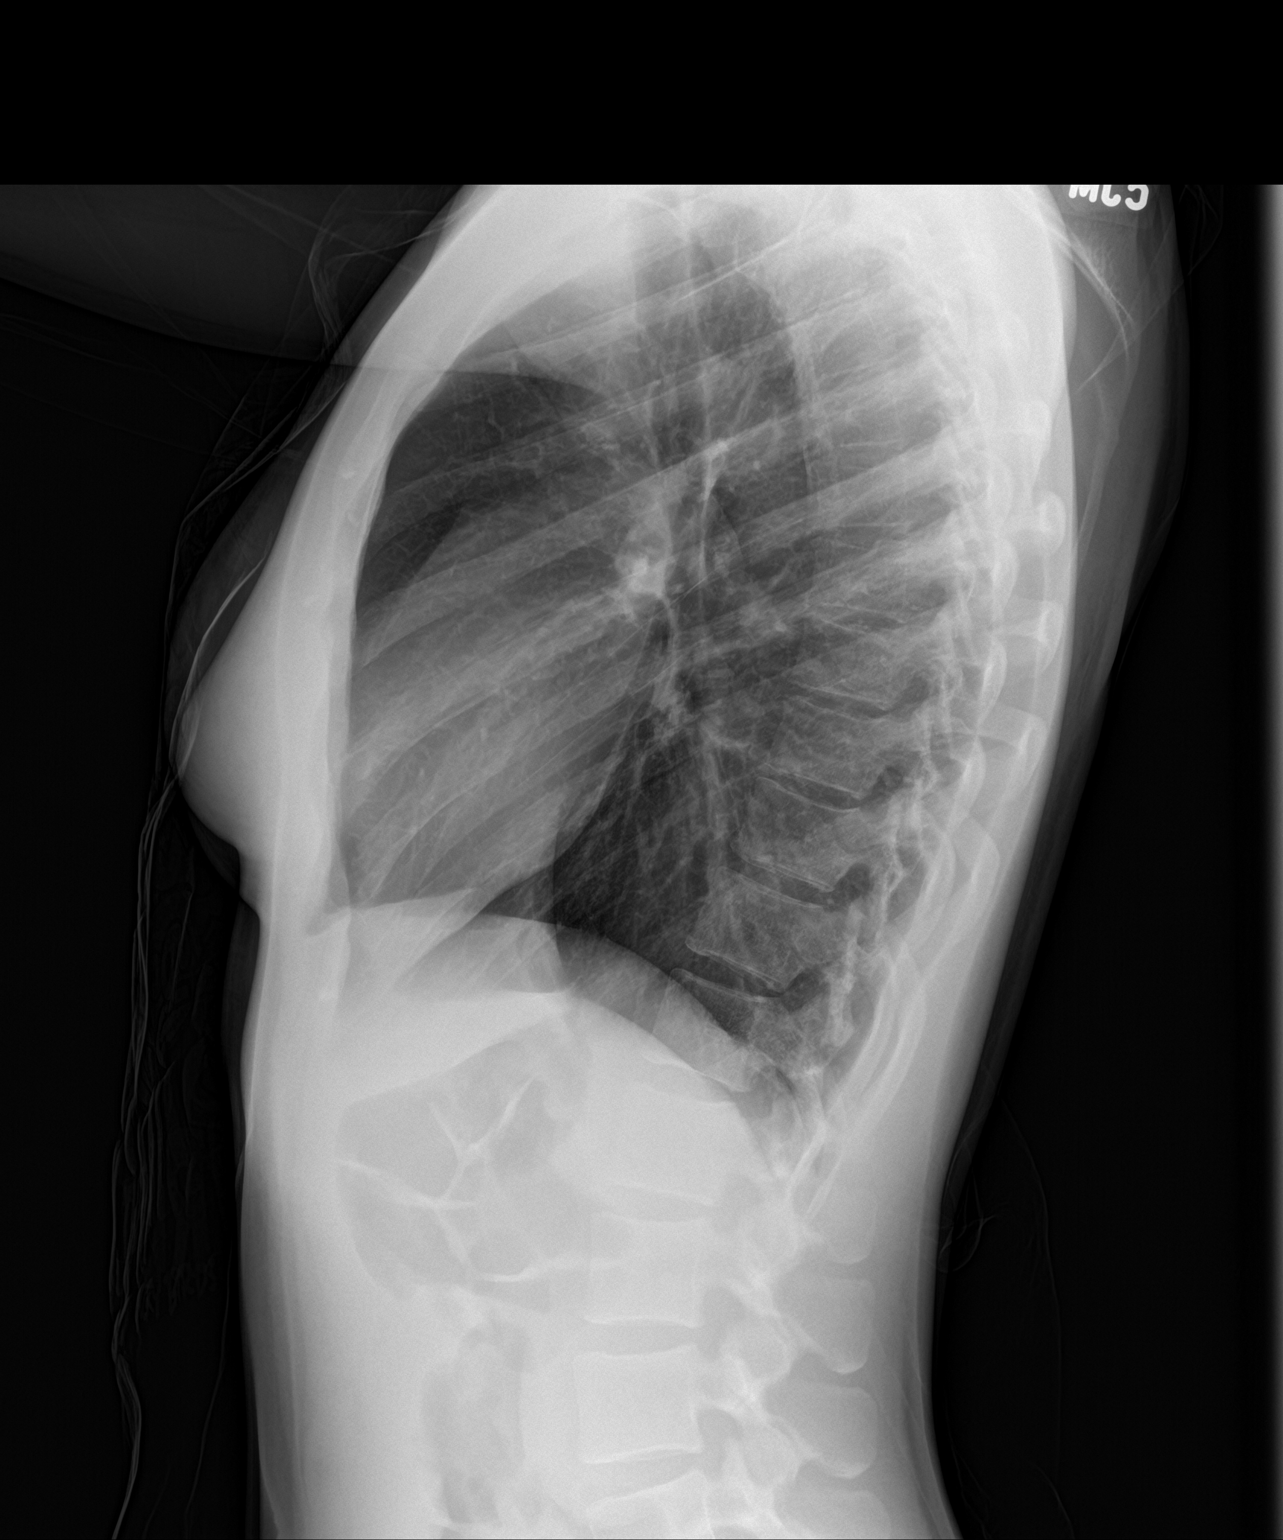

[2 of 2 positions shown; findings below may reference images not displayed]

FINDINGS: The lungs are clear. The pulmonary vasculature is normal. Heart size
is normal. Hilar and mediastinal contours are unremarkable. There is
no pleural effusion.
IMPRESSION: No active cardiopulmonary disease.

## 2018-03-06 ENCOUNTER — Emergency Department (HOSPITAL_COMMUNITY)
Admission: EM | Admit: 2018-03-06 | Discharge: 2018-03-07 | Disposition: A | Discharge status: Home / Self Care | Payer: Self-pay | Attending: Emergency Medicine | Admitting: Emergency Medicine

## 2018-03-06 ENCOUNTER — Encounter (HOSPITAL_COMMUNITY): Payer: Self-pay | Admitting: Emergency Medicine

## 2018-03-06 DIAGNOSIS — Z79899 Other long term (current) drug therapy: Secondary | ICD-10-CM | POA: Insufficient documentation

## 2018-03-06 DIAGNOSIS — K0889 Other specified disorders of teeth and supporting structures: Secondary | ICD-10-CM | POA: Insufficient documentation

## 2018-03-06 DIAGNOSIS — R112 Nausea with vomiting, unspecified: Secondary | ICD-10-CM | POA: Insufficient documentation

## 2018-03-06 LAB — CBC
HCT: 35.6 % — ABNORMAL LOW (ref 36.0–46.0)
Hemoglobin: 11.9 g/dL — ABNORMAL LOW (ref 12.0–15.0)
MCH: 29.1 pg (ref 26.0–34.0)
MCHC: 33.4 g/dL (ref 30.0–36.0)
MCV: 87 fL (ref 78.0–100.0)
PLATELETS: 352 10*3/uL (ref 150–400)
RBC: 4.09 MIL/uL (ref 3.87–5.11)
RDW: 11.8 % (ref 11.5–15.5)
WBC: 9.9 10*3/uL (ref 4.0–10.5)

## 2018-03-06 LAB — COMPREHENSIVE METABOLIC PANEL
ALT: 26 U/L (ref 14–54)
AST: 28 U/L (ref 15–41)
Albumin: 4.4 g/dL (ref 3.5–5.0)
Alkaline Phosphatase: 60 U/L (ref 38–126)
Anion gap: 11 (ref 5–15)
BUN: 10 mg/dL (ref 6–20)
CO2: 25 mmol/L (ref 22–32)
Calcium: 9.6 mg/dL (ref 8.9–10.3)
Chloride: 102 mmol/L (ref 101–111)
Creatinine, Ser: 0.8 mg/dL (ref 0.44–1.00)
GFR calc non Af Amer: >60 mL/min (ref >60)
GLUCOSE: 134 mg/dL — AB (ref 65–99)
Potassium: 3.5 mmol/L (ref 3.5–5.1)
Sodium: 138 mmol/L (ref 135–145)
Total Bilirubin: 1 mg/dL (ref 0.3–1.2)
Total Protein: 7.9 g/dL (ref 6.5–8.1)

## 2018-03-06 LAB — URINALYSIS, ROUTINE W REFLEX MICROSCOPIC
Bacteria, UA: NONE SEEN
Bilirubin Urine: NEGATIVE
GLUCOSE, UA: NEGATIVE mg/dL
Hgb urine dipstick: NEGATIVE
Ketones, ur: 80 mg/dL — AB
Leukocytes, UA: NEGATIVE
Nitrite: NEGATIVE
PH: 6 (ref 5.0–8.0)
Protein, ur: 100 mg/dL — AB
Specific Gravity, Urine: 1.04 — ABNORMAL HIGH (ref 1.005–1.030)

## 2018-03-06 LAB — I-STAT BETA HCG BLOOD, ED (MC, WL, AP ONLY): I-stat hCG, quantitative: <5 m[IU]/mL (ref <5)

## 2018-03-06 LAB — LIPASE, BLOOD: Lipase: 29 U/L (ref 11–51)

## 2018-03-06 MED ORDER — ONDANSETRON 4 MG PO TBDP: 4.0000 mg | ORAL_TABLET | Freq: Three times a day (TID) | ORAL | 0 refills | Status: DC | PRN

## 2018-03-06 MED ORDER — PENICILLIN V POTASSIUM 500 MG PO TABS: 500.0000 mg | ORAL_TABLET | Freq: Four times a day (QID) | ORAL | 0 refills | Status: DC

## 2018-03-06 NOTE — ED Triage Notes (Signed)
Pt c/o right upper dental pain onset yesterday, OTC no relief. Pt reports emesis x 10 today, denies diarrhea.

## 2018-03-06 NOTE — ED Provider Notes (Signed)
[facility] EMERGENCY DEPARTMENT Provider Note   CSN: [id number] Arrival date & time: 03/06/18  2001     History   Chief Complaint Chief Complaint  Patient presents with  . Dental Pain    HPI Olivia Farrell is a 27 y.o. female.  Patient presents to the emergency department with a chief complaint of toothache.  She reports that the toothache started 2 days ago.  She states that today she began having some nausea with vomiting.  She denies any fevers or chills.  She states that she has tried controlling her pain with ibuprofen.  She denies any diarrhea.  She rates her symptoms as moderate.  She does not have a dentist.  Denies any abdominal pain.  The history is provided by the patient. No language interpreter was used.    Past Medical History:  Diagnosis Date  . Anemia   . Preeclampsia 08/16/2012    Patient Active Problem List   Diagnosis Date Noted  . SGA (small for gestational age) 08/16/2012  . Preeclampsia 08/16/2012    Past Surgical History:  Procedure Laterality Date  . CESAREAN SECTION  08/17/2012   Procedure: CESAREAN SECTION;  Surgeon: Oliver Pila, MD;  Location: [facility];  Service: Obstetrics;  Laterality: N/A;  . CESAREAN SECTION       OB History    Gravida  2   Para  1   Term  0   Preterm  1   AB  0   Living  1     SAB  0   TAB  0   Ectopic  0   Multiple      Live Births  1            Home Medications    Prior to Admission medications   Medication Sig Start Date End Date Taking? Authorizing Provider  benzonatate (TESSALON) 100 MG capsule Take 2 capsules (200 mg total) by mouth 2 (two) times daily as needed for cough. 02/21/16   Arthor Captain, PA-C  ferrous sulfate 325 (65 FE) MG tablet Take 325 mg by mouth 2 (two) times daily.    [provider]  ibuprofen (ADVIL,MOTRIN) 600 MG tablet Take 1 tablet (600 mg total) by mouth every 6 (six) hours as needed for pain. 08/20/12   Tracey Harries, MD    levonorgestrel (MIRENA) 20 MCG/24HR IUD 1 each by Intrauterine route once.    [provider]  oxyCODONE-acetaminophen (PERCOCET/ROXICET) 5-325 MG per tablet Take 1-2 tablets by mouth every 6 (six) hours as needed (moderate - severe pain). 08/20/12   Tracey Harries, MD  predniSONE (DELTASONE) 20 MG tablet Take 2 tablets (40 mg total) by mouth daily. 02/21/16   Arthor Captain, PA-C  Prenatal Vit-Fe Fumarate-FA (PRENATAL MULTIVITAMIN) TABS Take 1 tablet by mouth daily.    [provider]    Family History No family history on file.  Social History Social History   Tobacco Use  . Smoking status: Never Smoker  . Smokeless tobacco: Never Used  Substance Use Topics  . Alcohol use: No  . Drug use: No     Allergies   Patient has no known allergies.   Review of Systems Review of Systems  All other systems reviewed and are negative.    Physical Exam Updated Vital Signs BP 123/76 (BP Location: Right Arm)   Pulse 72   Temp 98.1 F (36.7 C) (Oral)   Resp 18   Ht  (1.549 m)  Wt 46.3 kg (102 lb)   SpO2 100%   BMI 19.27 kg/m   Physical Exam Physical Exam  Constitutional: Pt appears well-developed and well-nourished.  HENT:  Head: Normocephalic.  Right Ear: Tympanic membrane, external ear and ear canal normal.  Left Ear: Tympanic membrane, external ear and ear canal normal.  Nose: Nose normal. Right sinus exhibits no maxillary sinus tenderness and no frontal sinus tenderness. Left sinus exhibits no maxillary sinus tenderness and no frontal sinus tenderness.  Mouth/Throat: Uvula is midline, oropharynx is clear and moist and mucous membranes are normal. No oral lesions. No uvula swelling or lacerations. No oropharyngeal exudate, posterior oropharyngeal edema, posterior oropharyngeal erythema or tonsillar abscesses.  Poor dentition No gingival swelling, fluctuance or induration No gross abscess  No sublingual edema, tenderness to palpation, or sign of  Ludwig's angina, or deep space infection Pain at right rear molars, bottom molars are broken Eyes: Conjunctivae are normal. Pupils are equal, round, and reactive to light. Right eye exhibits no discharge. Left eye exhibits no discharge.  Neck: Normal range of motion. Neck supple.  No stridor Handling secretions without difficulty No nuchal rigidity No cervical lymphadenopathy Cardiovascular: Normal rate, regular rhythm and normal heart sounds.   Pulmonary/Chest: Effort normal. No respiratory distress.  Equal chest rise  Abdominal: Soft. Bowel sounds are normal. Pt exhibits no distension. There is no tenderness.No focal abdominal tenderness, no RLQ tenderness or pain at McBurney's point, no RUQ tenderness or Murphy's sign, no left-sided abdominal tenderness, no fluid wave, or signs of peritonitis   Lymphadenopathy: Pt has no cervical adenopathy.  Neurological: Pt is alert and oriented x 4  Skin: Skin is warm and dry.  Psychiatric: Pt has a normal mood and affect.  Nursing note and vitals reviewed.    ED Treatments / Results  Labs (all labs ordered are listed, but only abnormal results are displayed) Labs Reviewed  COMPREHENSIVE METABOLIC PANEL - Abnormal; Notable for the following components:      Result Value   Glucose, Bld 134 (*)    All other components within normal limits  CBC - Abnormal; Notable for the following components:   Hemoglobin 11.9 (*)    HCT 35.6 (*)    All other components within normal limits  URINALYSIS, ROUTINE W REFLEX MICROSCOPIC - Abnormal; Notable for the following components:   Color, Urine AMBER (*)    APPearance HAZY (*)    Specific Gravity, Urine 1.040 (*)    Ketones, ur 80 (*)    Protein, ur 100 (*)    All other components within normal limits  LIPASE, BLOOD  I-STAT BETA HCG BLOOD, ED ([facility], [facility], [facility] ONLY)    EKG None  Radiology No results found.  Procedures Procedures (including critical care time)  Medications Ordered in ED Medications  - No data to display   Initial Impression / Assessment and Plan / ED Course  I have reviewed the triage vital signs and the nursing notes.  Pertinent labs & imaging results that were available during my care of the patient were reviewed by me and considered in my medical decision making (see chart for details).     Patient with broken right rear lower molars with gingivitis and probable developing abscess.  Nothing drainable at this time, but will start patient on antibiotic therapy.  Will give Zofran for nausea.  Laboratory work-up is reassuring.  Vital signs are normal.  Patient is in no acute distress.  Recommend dental follow-up.  Final Clinical Impressions(s) / ED Diagnoses  Final diagnoses:  Pain, dental  Nausea and vomiting, intractability of vomiting not specified, unspecified vomiting type    ED Discharge Orders        Ordered    penicillin v potassium (VEETID) 500 MG tablet  4 times daily     03/06/18 2315    ondansetron (ZOFRAN ODT) 4 MG disintegrating tablet  Every 8 hours PRN     03/06/18 2315       Roxy Horseman, PA-C 03/06/18 2317    Glynn Octave, MD 03/07/18 [phone]

## 2018-12-27 ENCOUNTER — Encounter (HOSPITAL_COMMUNITY): Payer: Self-pay

## 2018-12-27 ENCOUNTER — Other Ambulatory Visit: Payer: Self-pay

## 2018-12-27 ENCOUNTER — Emergency Department (HOSPITAL_COMMUNITY)
Admission: EM | Admit: 2018-12-27 | Discharge: 2018-12-27 | Disposition: A | Discharge status: Home / Self Care | Payer: 59 | Attending: Emergency Medicine | Admitting: Emergency Medicine

## 2018-12-27 DIAGNOSIS — J069 Acute upper respiratory infection, unspecified: Secondary | ICD-10-CM | POA: Insufficient documentation

## 2018-12-27 DIAGNOSIS — R05 Cough: Secondary | ICD-10-CM | POA: Diagnosis present

## 2018-12-27 DIAGNOSIS — B9789 Other viral agents as the cause of diseases classified elsewhere: Secondary | ICD-10-CM

## 2018-12-27 NOTE — ED Notes (Signed)
Patient verbalizes understanding of discharge instructions . Opportunity for questions and answers were provided . Armband removed by staff ,Pt discharged from ED. W/C  offered at D/C  and Declined W/C at D/C and was escorted to lobby by RN.  

## 2018-12-27 NOTE — ED Provider Notes (Signed)
[facility] EMERGENCY DEPARTMENT Provider Note   CSN: [id number] Arrival date & time: 12/27/18  1050    History   Chief Complaint Chief Complaint  Patient presents with   Cough    HPI Olivia Farrell is a 28 y.o. female who presents with URI symptoms.  Patient reports several days of nasal congestion, runny nose, dry cough, sneezing.  She is that she has been taking NyQuil and felt better yesterday but then this morning she felt a little bit worse and started have nosebleed in the left side.  This prompted her to come to the ED.  She denies denies any recent travel or sick contacts.  Nothing makes her symptoms better or worse.  She has had some nausea but no abdominal pain or vomiting.  No chest pain or shortness of breath.     HPI  Past Medical History:  Diagnosis Date   Anemia    Preeclampsia 08/16/2012    Patient Active Problem List   Diagnosis Date Noted   SGA (small for gestational age) 08/16/2012   Preeclampsia 08/16/2012    Past Surgical History:  Procedure Laterality Date   CESAREAN SECTION  08/17/2012   Procedure: CESAREAN SECTION;  Surgeon: Oliver Pila, MD;  Location: [facility];  Service: Obstetrics;  Laterality: N/A;   CESAREAN SECTION       OB History    Gravida  2   Para  1   Term  0   Preterm  1   AB  0   Living  1     SAB  0   TAB  0   Ectopic  0   Multiple      Live Births  1            Home Medications    Prior to Admission medications   Medication Sig Start Date End Date Taking? Authorizing Provider  benzonatate (TESSALON) 100 MG capsule Take 2 capsules (200 mg total) by mouth 2 (two) times daily as needed for cough. 02/21/16   Arthor Captain, PA-C  ferrous sulfate 325 (65 FE) MG tablet Take 325 mg by mouth 2 (two) times daily.    [provider]  ibuprofen (ADVIL,MOTRIN) 600 MG tablet Take 1 tablet (600 mg total) by mouth every 6 (six) hours as needed for pain. 08/20/12   Tracey Harries, MD  levonorgestrel (MIRENA) 20 MCG/24HR IUD 1 each by Intrauterine route once.    [provider]  ondansetron (ZOFRAN ODT) 4 MG disintegrating tablet Take 1 tablet (4 mg total) by mouth every 8 (eight) hours as needed for nausea or vomiting. 03/06/18   Roxy Horseman, PA-C  oxyCODONE-acetaminophen (PERCOCET/ROXICET) 5-325 MG per tablet Take 1-2 tablets by mouth every 6 (six) hours as needed (moderate - severe pain). 08/20/12   Tracey Harries, MD  penicillin v potassium (VEETID) 500 MG tablet Take 1 tablet (500 mg total) by mouth 4 (four) times daily. 03/06/18   Roxy Horseman, PA-C  predniSONE (DELTASONE) 20 MG tablet Take 2 tablets (40 mg total) by mouth daily. 02/21/16   Arthor Captain, PA-C  Prenatal Vit-Fe Fumarate-FA (PRENATAL MULTIVITAMIN) TABS Take 1 tablet by mouth daily.    [provider]    Family History No family history on file.  Social History Social History   Tobacco Use   Smoking status: Never Smoker   Smokeless tobacco: Never Used  Substance Use Topics   Alcohol use: No   Drug use: No  Allergies   Patient has no known allergies.   Review of Systems Review of Systems  Constitutional: Negative for fever.  HENT: Positive for congestion, nosebleeds and sore throat.   Respiratory: Positive for cough. Negative for shortness of breath.   Cardiovascular: Negative for chest pain.  Gastrointestinal: Negative for abdominal pain.     Physical Exam Updated Vital Signs BP 113/74 (BP Location: Right Arm)    Pulse 79    Temp 98.6 F (37 C) (Oral)    Resp 16    SpO2 99%   Physical Exam Vitals signs and nursing note reviewed.  Constitutional:      General: She is not in acute distress.    Appearance: Normal appearance. She is well-developed. She is not ill-appearing.  HENT:     Head: Normocephalic and atraumatic.     Right Ear: Tympanic membrane normal.     Left Ear: Tympanic membrane normal.     Nose: Nose normal.     Comments:  Nose ring in the L nares. No epistaxis    Mouth/Throat:     Lips: Pink.     Mouth: Mucous membranes are moist.     Pharynx: Oropharynx is clear.  Eyes:     General: No scleral icterus.       Right eye: No discharge.        Left eye: No discharge.     Conjunctiva/sclera: Conjunctivae normal.     Pupils: Pupils are equal, round, and reactive to light.  Neck:     Musculoskeletal: Normal range of motion.  Cardiovascular:     Rate and Rhythm: Normal rate and regular rhythm.  Pulmonary:     Effort: Pulmonary effort is normal. No respiratory distress.     Breath sounds: Normal breath sounds.  Abdominal:     General: There is no distension.  Skin:    General: Skin is warm and dry.  Neurological:     Mental Status: She is alert and oriented to person, place, and time.  Psychiatric:        Behavior: Behavior normal.      ED Treatments / Results  Labs (all labs ordered are listed, but only abnormal results are displayed) Labs Reviewed - No data to display  EKG None  Radiology No results found.  Procedures Procedures (including critical care time)  Medications Ordered in ED Medications - No data to display   Initial Impression / Assessment and Plan / ED Course  I have reviewed the triage vital signs and the nursing notes.  Pertinent labs & imaging results that were available during my care of the patient were reviewed by me and considered in my medical decision making (see chart for details).  28 year old female with URI symptoms for 5 days.  Her vital signs are normal.  She is well-appearing.  No travel or sick contacts.  Advised supportive care. Low suspicion for cold, flu, pneumonia.  Will discharge with return precautions.  Final Clinical Impressions(s) / ED Diagnoses   Final diagnoses:  Viral URI with cough    ED Discharge Orders    None       Bethel Born, PA-C 12/27/18 1141    Derwood Kaplan, MD 12/27/18 1314

## 2018-12-27 NOTE — ED Triage Notes (Signed)
Pt presents with 5 day h/o dry cough, nasal congestion and sneezing, reports "on and off" fever.  Pt reports noting nosebleed to L nare last night that stopped with pressure applied.

## 2018-12-27 NOTE — Discharge Instructions (Signed)
Please stay home, rest, and drink plenty of fluids.  Give Tylenol or Motrin for pain/fever Return to the ED for worsening symptoms

## 2020-05-18 ENCOUNTER — Emergency Department (HOSPITAL_COMMUNITY)
Admission: EM | Admit: 2020-05-18 | Discharge: 2020-05-18 | Disposition: A | Discharge status: Home / Self Care | Payer: 59 | Attending: Emergency Medicine | Admitting: Emergency Medicine

## 2020-05-18 ENCOUNTER — Encounter (HOSPITAL_COMMUNITY): Payer: Self-pay

## 2020-05-18 DIAGNOSIS — R05 Cough: Secondary | ICD-10-CM | POA: Diagnosis not present

## 2020-05-18 DIAGNOSIS — B349 Viral infection, unspecified: Secondary | ICD-10-CM

## 2020-05-18 DIAGNOSIS — U071 COVID-19: Secondary | ICD-10-CM | POA: Insufficient documentation

## 2020-05-18 DIAGNOSIS — R519 Headache, unspecified: Secondary | ICD-10-CM | POA: Insufficient documentation

## 2020-05-18 DIAGNOSIS — R0981 Nasal congestion: Secondary | ICD-10-CM | POA: Diagnosis not present

## 2020-05-18 DIAGNOSIS — R110 Nausea: Secondary | ICD-10-CM | POA: Diagnosis not present

## 2020-05-18 DIAGNOSIS — Z20822 Contact with and (suspected) exposure to covid-19: Secondary | ICD-10-CM | POA: Diagnosis not present

## 2020-05-18 DIAGNOSIS — R197 Diarrhea, unspecified: Secondary | ICD-10-CM | POA: Insufficient documentation

## 2020-05-18 NOTE — ED Provider Notes (Signed)
[facility] EMERGENCY DEPARTMENT Provider Note   CSN: [id number] Arrival date & time: 05/18/20  1504     History No chief complaint on file.   Olivia Farrell is a 29 y.o. female who presents with multiple complaints.  Patient states that she started to feel sick on Tuesday.  She reports fever, chills, headache, a cough which is productive at times, nausea.  She is also had some nasal congestion.  She has been taking Tylenol for her symptoms without significant relief.  Her significant other is at bedside who is also here for an evaluation and tested for Covid.  Neither the patient or her significant other have been vaccinated.  She denies sore throat, chest pain, shortness of breath, abdominal pain, vomiting or diarrhea or urinary symptoms.  HPI     Past Medical History:  Diagnosis Date  . Anemia   . Preeclampsia 08/16/2012    Patient Active Problem List   Diagnosis Date Noted  . SGA (small for gestational age) 08/16/2012  . Preeclampsia 08/16/2012    Past Surgical History:  Procedure Laterality Date  . CESAREAN SECTION  08/17/2012   Procedure: CESAREAN SECTION;  Surgeon: Oliver Pila, MD;  Location: [facility];  Service: Obstetrics;  Laterality: N/A;  . CESAREAN SECTION       OB History    Gravida  2   Para  1   Term  0   Preterm  1   AB  0   Living  1     SAB  0   TAB  0   Ectopic  0   Multiple      Live Births  1           No family history on file.  Social History   Tobacco Use  . Smoking status: Never Smoker  . Smokeless tobacco: Never Used  Substance Use Topics  . Alcohol use: No  . Drug use: No    Home Medications Prior to Admission medications   Medication Sig Start Date End Date Taking? Authorizing Provider  benzonatate (TESSALON) 100 MG capsule Take 2 capsules (200 mg total) by mouth 2 (two) times daily as needed for cough. 02/21/16   Arthor Captain, PA-C  ferrous sulfate 325 (65 FE) MG tablet Take 325  mg by mouth 2 (two) times daily.    [provider]  ibuprofen (ADVIL,MOTRIN) 600 MG tablet Take 1 tablet (600 mg total) by mouth every 6 (six) hours as needed for pain. 08/20/12   Tracey Harries, MD  levonorgestrel (MIRENA) 20 MCG/24HR IUD 1 each by Intrauterine route once.    [provider]  ondansetron (ZOFRAN ODT) 4 MG disintegrating tablet Take 1 tablet (4 mg total) by mouth every 8 (eight) hours as needed for nausea or vomiting. 03/06/18   Roxy Horseman, PA-C  oxyCODONE-acetaminophen (PERCOCET/ROXICET) 5-325 MG per tablet Take 1-2 tablets by mouth every 6 (six) hours as needed (moderate - severe pain). 08/20/12   Tracey Harries, MD  penicillin v potassium (VEETID) 500 MG tablet Take 1 tablet (500 mg total) by mouth 4 (four) times daily. 03/06/18   Roxy Horseman, PA-C  predniSONE (DELTASONE) 20 MG tablet Take 2 tablets (40 mg total) by mouth daily. 02/21/16   Arthor Captain, PA-C  Prenatal Vit-Fe Fumarate-FA (PRENATAL MULTIVITAMIN) TABS Take 1 tablet by mouth daily.    [provider]    Allergies    Patient has no known allergies.  Review of Systems   Review  of Systems  Constitutional: Positive for chills, fatigue and fever.  HENT: Positive for congestion and rhinorrhea.   Respiratory: Positive for cough. Negative for shortness of breath.   Cardiovascular: Negative for chest pain.  Gastrointestinal: Positive for diarrhea and nausea. Negative for abdominal pain and vomiting.  Genitourinary: Negative for dysuria.  Neurological: Positive for headaches.    Physical Exam Updated Vital Signs BP 113/76 (BP Location: Right Arm)   Pulse 100   Temp 99.3 F (37.4 C) (Oral)   Resp 16   Ht 5\' 1"  (1.549 m)   Wt 45.8 kg   SpO2 99%   BMI 19.08 kg/m   Physical Exam Vitals and nursing note reviewed.  Constitutional:      General: She is not in acute distress.    Appearance: Normal appearance. She is well-developed. She is not ill-appearing.     Comments:  Calm, cooperative. NAD  HENT:     Head: Normocephalic and atraumatic.     Right Ear: Tympanic membrane normal.     Left Ear: Tympanic membrane normal.     Nose: Nose normal.     Mouth/Throat:     Mouth: Mucous membranes are moist.  Eyes:     General: No scleral icterus.       Right eye: No discharge.        Left eye: No discharge.     Conjunctiva/sclera: Conjunctivae normal.     Pupils: Pupils are equal, round, and reactive to light.  Cardiovascular:     Rate and Rhythm: Normal rate and regular rhythm.  Pulmonary:     Effort: Pulmonary effort is normal. No respiratory distress.     Breath sounds: Normal breath sounds.  Abdominal:     General: There is no distension.     Palpations: Abdomen is soft.     Tenderness: There is no abdominal tenderness.  Musculoskeletal:     Cervical back: Normal range of motion.  Skin:    General: Skin is warm and dry.  Neurological:     Mental Status: She is alert and oriented to person, place, and time.  Psychiatric:        Behavior: Behavior normal.     ED Results / Procedures / Treatments   Labs (all labs ordered are listed, but only abnormal results are displayed) Labs Reviewed  SARS CORONAVIRUS 2 (TAT 6-24 HRS)    EKG None  Radiology No results found.  Procedures Procedures (including critical care time)  Medications Ordered in ED Medications - No data to display  ED Course  I have reviewed the triage vital signs and the nursing notes.  Pertinent labs & imaging results that were available during my care of the patient were reviewed by me and considered in my medical decision making (see chart for details).  29 year old female presents with several days of fever, chills, headache, body aches, cough.  Her significant other is at bedside and has similar symptoms and is currently being tested for Covid.  Her vital signs are normal here.  Exam is consistent with viral infection.  Patient has not been vaccinated against Covid.   Will obtain Covid testing but at this time I do not feel she needs any further work-up with labs or imaging.  She was given reasons to return to the ER if worsening.  Olivia Farrell was evaluated in Emergency Department on 05/18/2020 for the symptoms described in the history of present illness. She was evaluated in the context of the global COVID-19 pandemic,  which necessitated consideration that the patient might be at risk for infection with the SARS-CoV-2 virus that causes COVID-19. Institutional protocols and algorithms that pertain to the evaluation of patients at risk for COVID-19 are in a state of rapid change based on information released by regulatory bodies including the CDC and federal and state organizations. These policies and algorithms were followed during the patient's care in the ED.  MDM Rules/Calculators/A&P                           Final Clinical Impression(s) / ED Diagnoses Final diagnoses:  None    Rx / DC Orders ED Discharge Orders    None       Bethel Born, PA-C 05/18/20 1852    Sabas Sous, MD 05/18/20 2048

## 2020-05-18 NOTE — ED Triage Notes (Addendum)
Pt presents to ED with complaints of chills, body aches,  Feeling like she had fever (did not check), headache, nausea, productive cough x 1 week. Did not receive covid vaccine.

## 2020-05-18 NOTE — Discharge Instructions (Signed)
Please quarantine for a full 10 days since the start of your symptoms  You can return to normal once 10 days has passed and you have been 24 hours without a fever without taking Tylenol or Ibuprofen and your symptoms are improving Please rest and stay hydrated Take over the counter medicines for cough Please return to the ER if you are having trouble breathing

## 2020-05-19 LAB — SARS CORONAVIRUS 2 (TAT 6-24 HRS): SARS Coronavirus 2: POSITIVE — AB

## 2020-08-03 ENCOUNTER — Encounter (HOSPITAL_COMMUNITY): Payer: Self-pay | Admitting: Emergency Medicine

## 2020-08-03 ENCOUNTER — Emergency Department (HOSPITAL_COMMUNITY): Payer: 59

## 2020-08-03 ENCOUNTER — Emergency Department (HOSPITAL_COMMUNITY)
Admission: EM | Admit: 2020-08-03 | Discharge: 2020-08-03 | Disposition: A | Discharge status: Home / Self Care | Payer: 59 | Attending: Emergency Medicine | Admitting: Emergency Medicine

## 2020-08-03 DIAGNOSIS — W2209XA Striking against other stationary object, initial encounter: Secondary | ICD-10-CM | POA: Diagnosis not present

## 2020-08-03 DIAGNOSIS — S60931A Unspecified superficial injury of right thumb, initial encounter: Secondary | ICD-10-CM | POA: Diagnosis present

## 2020-08-03 DIAGNOSIS — S61011A Laceration without foreign body of right thumb without damage to nail, initial encounter: Secondary | ICD-10-CM | POA: Insufficient documentation

## 2020-08-03 DIAGNOSIS — Z23 Encounter for immunization: Secondary | ICD-10-CM | POA: Diagnosis not present

## 2020-08-03 MED ORDER — LIDOCAINE-EPINEPHRINE (PF) 2 %-1:200000 IJ SOLN
20.0000 mL | Freq: Once | INTRAMUSCULAR | Status: AC
  Administered 2020-08-03: 20 mL via INTRADERMAL
  Filled 2020-08-03: qty 20

## 2020-08-03 MED ORDER — TETANUS-DIPHTH-ACELL PERTUSSIS 5-2.5-18.5 LF-MCG/0.5 IM SUSP
0.5000 mL | Freq: Once | INTRAMUSCULAR | Status: AC
  Administered 2020-08-03: 0.5 mL via INTRAMUSCULAR
  Filled 2020-08-03: qty 0.5

## 2020-08-03 NOTE — Discharge Instructions (Signed)
Keep area clean by washing with soap and water daily. Do not submerge in water or scrub stitches Apply a bandage at least once daily, change more often if it is dirty Watch for signs of infection (redness, drainage, worsening pain) Take Tylenol or Ibuprofen for pain as needed Have stitches removed in 7-10 days  

## 2020-08-03 NOTE — ED Provider Notes (Addendum)
[facility]-EMERGENCY DEPT Provider Note   CSN: [id number] Arrival date & time: 08/03/20  1723     History Chief Complaint  Patient presents with  . Laceration    Olivia Farrell is a 29 y.o. female.  HPI 29 year old female with history of anemia, pre-eclampsia presents to the ER with a laceration to her right thumb after punching a lamp. Denies any numbness or tingling. Able to move all 5 digits. Bleeding controlled on arrival. Unknown tetanus status.    Past Medical History:  Diagnosis Date  . Anemia   . Preeclampsia 08/16/2012    Patient Active Problem List   Diagnosis Date Noted  . SGA (small for gestational age) 08/16/2012  . Preeclampsia 08/16/2012    Past Surgical History:  Procedure Laterality Date  . CESAREAN SECTION  08/17/2012   Procedure: CESAREAN SECTION;  Surgeon: Oliver Pila, MD;  Location: [facility];  Service: Obstetrics;  Laterality: N/A;  . CESAREAN SECTION       OB History    Gravida  2   Para  1   Term  0   Preterm  1   AB  0   Living  1     SAB  0   TAB  0   Ectopic  0   Multiple      Live Births  1           No family history on file.  Social History   Tobacco Use  . Smoking status: Never Smoker  . Smokeless tobacco: Never Used  Substance Use Topics  . Alcohol use: No  . Drug use: No    Home Medications Prior to Admission medications   Medication Sig Start Date End Date Taking? Authorizing Provider  benzonatate (TESSALON) 100 MG capsule Take 2 capsules (200 mg total) by mouth 2 (two) times daily as needed for cough. 02/21/16   Arthor Captain, PA-C  ferrous sulfate 325 (65 FE) MG tablet Take 325 mg by mouth 2 (two) times daily.    [provider]  ibuprofen (ADVIL,MOTRIN) 600 MG tablet Take 1 tablet (600 mg total) by mouth every 6 (six) hours as needed for pain. 08/20/12   Tracey Harries, MD  levonorgestrel (MIRENA) 20 MCG/24HR IUD 1 each by Intrauterine route once.     [provider]  ondansetron (ZOFRAN ODT) 4 MG disintegrating tablet Take 1 tablet (4 mg total) by mouth every 8 (eight) hours as needed for nausea or vomiting. 03/06/18   Roxy Horseman, PA-C  oxyCODONE-acetaminophen (PERCOCET/ROXICET) 5-325 MG per tablet Take 1-2 tablets by mouth every 6 (six) hours as needed (moderate - severe pain). 08/20/12   Tracey Harries, MD  penicillin v potassium (VEETID) 500 MG tablet Take 1 tablet (500 mg total) by mouth 4 (four) times daily. 03/06/18   Roxy Horseman, PA-C  predniSONE (DELTASONE) 20 MG tablet Take 2 tablets (40 mg total) by mouth daily. 02/21/16   Arthor Captain, PA-C  Prenatal Vit-Fe Fumarate-FA (PRENATAL MULTIVITAMIN) TABS Take 1 tablet by mouth daily.    [provider]    Allergies    Patient has no known allergies.  Review of Systems   Review of Systems  Skin: Positive for color change and wound.  Neurological: Negative for weakness and numbness.    Physical Exam Updated Vital Signs BP 116/78   Pulse 78   Temp 98.5 F (36.9 C)   Resp 17   SpO2 98%   Physical Exam Vitals reviewed.  Constitutional:  Appearance: Normal appearance.  HENT:     Head: Normocephalic and atraumatic.  Eyes:     General:        Right eye: No discharge.        Left eye: No discharge.     Extraocular Movements: Extraocular movements intact.     Conjunctiva/sclera: Conjunctivae normal.  Musculoskeletal:        General: No swelling. Normal range of motion.       Hands:     Comments: 5/5 grip strength to right hand, multiple superficial abrasions the the right hand with a more deep 2cm x77mm laceration to the dorsal aspect of the right MTP and MTP joint. Full ROM and strength of right thumb including flexion and extension. No snuffbox tenderness. Bleeding controlled, no visible foreign bodies noted. <2 cap refill  Skin:    Findings: Erythema and lesion present. No bruising or rash.  Neurological:     General: No focal deficit  present.     Mental Status: She is alert and oriented to person, place, and time.     Sensory: No sensory deficit.     Motor: No weakness.  Psychiatric:        Mood and Affect: Mood normal.        Behavior: Behavior normal.     ED Results / Procedures / Treatments   Labs (all labs ordered are listed, but only abnormal results are displayed) Labs Reviewed - No data to display  EKG None  Radiology DG Hand Complete Right  Result Date: 08/03/2020 CLINICAL DATA:  Status post trauma. EXAM: RIGHT HAND - COMPLETE 3+ VIEW COMPARISON:  None. FINDINGS: There is no evidence of fracture or dislocation. There is no evidence of arthropathy or other focal bone abnormality. A 4 mm linear radiopaque soft tissue foreign body is seen within the distal aspect of the fifth right finger. IMPRESSION: 1. Small radiopaque soft tissue foreign body within the distal aspect of the fifth right finger. Electronically Signed   By: Aram Candela M.D.   On: 08/03/2020 17:48    Procedures .Marland KitchenLaceration Repair  Date/Time: 08/03/2020 7:00 PM Performed by: Mare Ferrari, PA-C Authorized by: Mare Ferrari, PA-C   Consent:    Consent obtained:  Verbal   Consent given by:  Patient   Risks discussed:  Infection, need for additional repair, pain, poor cosmetic result and poor wound healing   Alternatives discussed:  No treatment and delayed treatment Universal protocol:    Procedure explained and questions answered to patient or proxy's satisfaction: yes     Relevant documents present and verified: yes     Test results available and properly labeled: yes     Imaging studies available: yes     Required blood products, implants, devices, and special equipment available: yes     Site/side marked: yes     Immediately prior to procedure, a time out was called: yes     Patient identity confirmed:  Verbally with patient Anesthesia (see MAR for exact dosages):    Anesthesia method:  Local infiltration   Local  anesthetic:  Lidocaine 2% WITH epi Laceration details:    Location:  Finger   Finger location:  R thumb   Length (cm):  2   Depth (mm):  1 Repair type:    Repair type:  Simple Exploration:    Hemostasis achieved with:  Direct pressure   Wound exploration: wound explored through full range of motion     Wound extent: no  areolar tissue violation noted, no fascia violation noted, no foreign bodies/material noted, no nerve damage noted, no tendon damage noted and no underlying fracture noted     Contaminated: no   Treatment:    Area cleansed with:  Betadine and saline   Amount of cleaning:  Standard   Irrigation volume:  30   Irrigation method:  Syringe Skin repair:    Repair method:  Sutures   Suture size:  5-0   Suture material:  Prolene   Suture technique:  Simple interrupted   Number of sutures:  3 Approximation:    Approximation:  Close Post-procedure details:    Dressing:  Non-adherent dressing   Patient tolerance of procedure:  Tolerated well, no immediate complications   (including critical care time)  Medications Ordered in ED Medications  Tdap (BOOSTRIX) injection 0.5 mL (0.5 mLs Intramuscular Given 08/03/20 1900)  lidocaine-EPINEPHrine (XYLOCAINE W/EPI) 2 %-1:200000 (PF) injection 20 mL (20 mLs Intradermal Given by Other 08/03/20 1755)    ED Course  I have reviewed the triage vital signs and the nursing notes.  Pertinent labs & imaging results that were available during my care of the patient were reviewed by me and considered in my medical decision making (see chart for details).    MDM Rules/Calculators/A&P                         Pressure irrigation performed. Wound explored and base of wound visualized in a bloodless field without evidence of foreign body.  Laceration occurred < 8 hours prior to repair which was well tolerated. Plain films without underlying fractures, there was a radiopaque body noted in the fifth distal digit pad of the right hand consistent  with a piece of glass which I removed without difficulty. Tdap updated.  Pt has no comorbidities to effect normal wound healing. Pt discharged  without antibiotics.  Discussed suture home care with patient and answered questions. Pt to follow-up for wound check and suture removal in 7 days; they are to return to the ED sooner for signs of infection. Pt is hemodynamically stable with no complaints prior to dc.   Final Clinical Impression(s) / ED Diagnoses Final diagnoses:  Laceration of right thumb without foreign body without damage to nail, initial encounter    Rx / DC Orders ED Discharge Orders    None           Leone Brand 08/03/20 1904    Pollyann Savoy, MD 08/03/20 [phone]

## 2020-08-03 NOTE — ED Triage Notes (Signed)
Patient reports punching lamp with right hand. Laceration to right thumb.

## 2020-08-07 ENCOUNTER — Other Ambulatory Visit: Payer: Self-pay

## 2020-08-07 ENCOUNTER — Ambulatory Visit (HOSPITAL_COMMUNITY)
Admission: EM | Admit: 2020-08-07 | Discharge: 2020-08-07 | Disposition: A | Discharge status: Home / Self Care | Payer: 59 | Attending: Nurse Practitioner | Admitting: Nurse Practitioner

## 2020-08-07 ENCOUNTER — Encounter (HOSPITAL_COMMUNITY): Payer: Self-pay

## 2020-08-07 DIAGNOSIS — L089 Local infection of the skin and subcutaneous tissue, unspecified: Secondary | ICD-10-CM

## 2020-08-07 DIAGNOSIS — S61011D Laceration without foreign body of right thumb without damage to nail, subsequent encounter: Secondary | ICD-10-CM | POA: Diagnosis not present

## 2020-08-07 MED ORDER — CEPHALEXIN 500 MG PO CAPS: 500.0000 mg | ORAL_CAPSULE | Freq: Three times a day (TID) | ORAL | 0 refills | Status: AC

## 2020-08-07 NOTE — Discharge Instructions (Signed)
Keep the wound clean and dry. Do not get the wound soaked in water until after the stitches or staples have been removed. Clean the wound once a day, or as told by your doctor: Wash the wound with soap and water. Rinse the wound with water to remove all soap. Pat the wound dry with a clean towel. Do not rub the wound. After you clean the wound, put a thin layer of antibiotic ointment on it  Have your stitches removed as you were told

## 2020-08-07 NOTE — ED Provider Notes (Signed)
[facility]    CSN: [id number] Arrival date & time: 08/07/20  1723      History   Chief Complaint Chief Complaint  Patient presents with  . finger infection    HPI Olivia Farrell is a 29 y.o. female.   Subjective:   Olivia Farrell is a 29 y.o. female who presents today for wound check. Patient has a laceration wound which is located to the right thumb.  It was evaluated and repaired on 08/03/2020.  3 sutures were placed at that time. Current symptoms: pain, erythema and one suture that has fallen out. Symptoms began 1 day ago.         Past Medical History:  Diagnosis Date  . Anemia   . Preeclampsia 08/16/2012    Patient Active Problem List   Diagnosis Date Noted  . SGA (small for gestational age) 08/16/2012  . Preeclampsia 08/16/2012    Past Surgical History:  Procedure Laterality Date  . CESAREAN SECTION  08/17/2012   Procedure: CESAREAN SECTION;  Surgeon: Oliver Pila, MD;  Location: [facility];  Service: Obstetrics;  Laterality: N/A;  . CESAREAN SECTION      OB History    Gravida  2   Para  1   Term  0   Preterm  1   AB  0   Living  1     SAB  0   TAB  0   Ectopic  0   Multiple      Live Births  1            Home Medications    Prior to Admission medications   Medication Sig Start Date End Date Taking? Authorizing Provider  cephALEXin (KEFLEX) 500 MG capsule Take 1 capsule (500 mg total) by mouth 3 (three) times daily for 7 days. 08/07/20 08/14/20  Lurline Idol, FNP  ferrous sulfate 325 (65 FE) MG tablet Take 325 mg by mouth 2 (two) times daily.    [provider]  ibuprofen (ADVIL,MOTRIN) 600 MG tablet Take 1 tablet (600 mg total) by mouth every 6 (six) hours as needed for pain. 08/20/12   Tracey Harries, MD  levonorgestrel (MIRENA) 20 MCG/24HR IUD 1 each by Intrauterine route once.    [provider]  Prenatal Vit-Fe Fumarate-FA (PRENATAL MULTIVITAMIN) TABS Take 1 tablet by mouth  daily.    [provider]    Family History Family History  Problem Relation Age of Onset  . Hypertension Mother   . Diabetes Mother   . Cancer Father     Social History Social History   Tobacco Use  . Smoking status: Never Smoker  . Smokeless tobacco: Never Used  Substance Use Topics  . Alcohol use: Yes  . Drug use: No     Allergies   Patient has no known allergies.   Review of Systems Review of Systems  Constitutional: Negative for fever.  Skin: Positive for wound.     Physical Exam Triage Vital Signs ED Triage Vitals  Enc Vitals Group     BP 08/07/20 1813 111/69     Pulse Rate 08/07/20 1813 73     Resp 08/07/20 1813 16     Temp 08/07/20 1813 98.3 F (36.8 C)     Temp Source 08/07/20 1813 Oral     SpO2 08/07/20 1813 98 %     Weight --      Height --      Head Circumference --  Peak Flow --      Pain Score 08/07/20 1811 0     Pain Loc --      Pain Edu? --      Excl. in GC? --    No data found.  Updated Vital Signs BP 111/69 (BP Location: Right Arm)   Pulse 73   Temp 98.3 F (36.8 C) (Oral)   Resp 16   SpO2 98%   Visual Acuity Right Eye Distance:   Left Eye Distance:   Bilateral Distance:    Right Eye Near:   Left Eye Near:    Bilateral Near:     Physical Exam Vitals reviewed.  Constitutional:      Appearance: Normal appearance.  HENT:     Head: Normocephalic.  Cardiovascular:     Rate and Rhythm: Normal rate and regular rhythm.  Pulmonary:     Effort: Pulmonary effort is normal.  Musculoskeletal:        General: Normal range of motion.     Right hand: Laceration present.     Cervical back: Normal range of motion.     Comments: Laceration noted to right thumb. 2 out of 3 sutures in place. Surrounding erythema noted. Mild swelling. Limited ROM due to pain and swelling. NVI.   Skin:    General: Skin is warm and dry.  Neurological:     General: No focal deficit present.     Mental Status: She is alert and oriented  to person, place, and time.  Psychiatric:        Mood and Affect: Mood normal.        Behavior: Behavior normal.      UC Treatments / Results  Labs (all labs ordered are listed, but only abnormal results are displayed) Labs Reviewed - No data to display  EKG   Radiology No results found.  Procedures Procedures (including critical care time)  Medications Ordered in UC Medications - No data to display  Initial Impression / Assessment and Plan / UC Course  I have reviewed the triage vital signs and the nursing notes.  Pertinent labs & imaging results that were available during my care of the patient were reviewed by me and considered in my medical decision making (see chart for details).    29 yo female presenting with possible infection of right thumb laceration. Wound looks well. Surrounding erythema and mild swelling present. Decreased ROM of thumb due to pain and swelling. No other deficits noted. Discussed appropriate home care of this wound., Dispensed dressing supplies and instructions on their use. Antibiotics per orders.  Today's evaluation has revealed no signs of a dangerous process. Discussed diagnosis with patient and/or guardian. Patient and/or guardian aware of their diagnosis, possible red flag symptoms to watch out for and need for close follow up. Patient and/or guardian understands verbal and written discharge instructions. Patient and/or guardian comfortable with plan and disposition.  Patient and/or guardian has a clear mental status at this time, good insight into illness (after discussion and teaching) and has clear judgment to make decisions regarding their care  This care was provided during an unprecedented National Emergency due to the Novel Coronavirus (COVID-19) pandemic. COVID-19 infections and transmission risks place heavy strains on healthcare resources.  As this pandemic evolves, our facility, providers, and staff strive to respond fluidly, to remain  operational, and to provide care relative to available resources and information. Outcomes are unpredictable and treatments are without well-defined guidelines. Further, the impact of COVID-19 on all  aspects of urgent care, including the impact to patients seeking care for reasons other than COVID-19, is unavoidable during this national emergency. At this time of the global pandemic, management of patients has significantly changed, even for non-COVID positive patients given high local and regional COVID volumes at this time requiring high healthcare system and resource utilization. The standard of care for management of both COVID suspected and non-COVID suspected patients continues to change rapidly at the local, regional, national, and global levels. This patient was worked up and treated to the best available but ever changing evidence and resources available at this current time.   Documentation was completed with the aid of voice recognition software. Transcription may contain typographical errors. Final Clinical Impressions(s) / UC Diagnoses   Final diagnoses:  Laceration of left thumb with infection, subsequent encounter     Discharge Instructions     1. Keep the wound clean and dry. 2. Do not get the wound soaked in water until after the stitches or staples have been removed. 3. Clean the wound once a day, or as told by your doctor: ? Wash the wound with soap and water. ? Rinse the wound with water to remove all soap. ? Pat the wound dry with a clean towel. Do not rub the wound. 4. After you clean the wound, put a thin layer of antibiotic ointment on it  5. Have your stitches removed as you were told     ED Prescriptions    Medication Sig Dispense Auth. Provider   cephALEXin (KEFLEX) 500 MG capsule Take 1 capsule (500 mg total) by mouth 3 (three) times daily for 7 days. 21 capsule Lurline Idol, FNP     PDMP not reviewed this encounter.   Lurline Idol, [place] 08/07/20  [phone]

## 2020-08-07 NOTE — ED Triage Notes (Signed)
Pt is here after one of her sutures came out & and the site is infection, throbbing and painful since last night.
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
## 2022-05-26 ENCOUNTER — Ambulatory Visit: Payer: BLUE CROSS/BLUE SHIELD | Admitting: Nurse Practitioner

## 2023-01-30 ENCOUNTER — Ambulatory Visit (INDEPENDENT_AMBULATORY_CARE_PROVIDER_SITE_OTHER): Payer: BLUE CROSS/BLUE SHIELD

## 2023-01-30 ENCOUNTER — Encounter (HOSPITAL_COMMUNITY): Payer: Self-pay | Admitting: *Deleted

## 2023-01-30 ENCOUNTER — Ambulatory Visit (HOSPITAL_COMMUNITY)
Admission: EM | Admit: 2023-01-30 | Discharge: 2023-01-30 | Disposition: A | Discharge status: Home / Self Care | Payer: BLUE CROSS/BLUE SHIELD | Attending: Family Medicine | Admitting: Family Medicine

## 2023-01-30 DIAGNOSIS — S2242XA Multiple fractures of ribs, left side, initial encounter for closed fracture: Secondary | ICD-10-CM | POA: Diagnosis not present

## 2023-01-30 MED ORDER — IBUPROFEN 800 MG PO TABS: 800.0000 mg | ORAL_TABLET | Freq: Three times a day (TID) | ORAL | 0 refills | Status: DC | PRN

## 2023-01-30 MED ORDER — KETOROLAC TROMETHAMINE 30 MG/ML IJ SOLN
INTRAMUSCULAR | Status: AC
  Filled 2023-01-30: qty 1

## 2023-01-30 MED ORDER — HYDROCODONE-ACETAMINOPHEN 5-325 MG PO TABS: 1.0000 | ORAL_TABLET | Freq: Four times a day (QID) | ORAL | 0 refills | Status: DC | PRN

## 2023-01-30 MED ORDER — KETOROLAC TROMETHAMINE 30 MG/ML IJ SOLN
30.0000 mg | Freq: Once | INTRAMUSCULAR | Status: AC
  Administered 2023-01-30: 30 mg via INTRAMUSCULAR
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
## 2024-08-11 ENCOUNTER — Other Ambulatory Visit: Payer: Self-pay

## 2024-08-11 ENCOUNTER — Ambulatory Visit (INDEPENDENT_AMBULATORY_CARE_PROVIDER_SITE_OTHER)
Admission: EM | Admit: 2024-08-11 | Discharge: 2024-08-11 | Disposition: A | Discharge status: Home / Self Care | Source: Home / Self Care | Attending: Family Medicine | Admitting: Family Medicine

## 2024-08-11 ENCOUNTER — Encounter (HOSPITAL_COMMUNITY): Payer: Self-pay

## 2024-08-11 ENCOUNTER — Emergency Department (HOSPITAL_COMMUNITY)

## 2024-08-11 ENCOUNTER — Inpatient Hospital Stay (HOSPITAL_COMMUNITY)
Admission: EM | Admit: 2024-08-11 | Discharge: 2024-08-15 | DRG: 757 | Disposition: A | Discharge status: Home / Self Care | Attending: Internal Medicine | Admitting: Internal Medicine

## 2024-08-11 DIAGNOSIS — N760 Acute vaginitis: Principal | ICD-10-CM | POA: Diagnosis present

## 2024-08-11 DIAGNOSIS — N739 Female pelvic inflammatory disease, unspecified: Secondary | ICD-10-CM | POA: Diagnosis not present

## 2024-08-11 DIAGNOSIS — B9689 Other specified bacterial agents as the cause of diseases classified elsewhere: Secondary | ICD-10-CM

## 2024-08-11 DIAGNOSIS — N730 Acute parametritis and pelvic cellulitis: Secondary | ICD-10-CM | POA: Diagnosis present

## 2024-08-11 DIAGNOSIS — R1031 Right lower quadrant pain: Secondary | ICD-10-CM

## 2024-08-11 DIAGNOSIS — K3532 Acute appendicitis with perforation and localized peritonitis, without abscess: Secondary | ICD-10-CM | POA: Diagnosis present

## 2024-08-11 DIAGNOSIS — E871 Hypo-osmolality and hyponatremia: Secondary | ICD-10-CM | POA: Diagnosis present

## 2024-08-11 DIAGNOSIS — Z833 Family history of diabetes mellitus: Secondary | ICD-10-CM | POA: Diagnosis not present

## 2024-08-11 DIAGNOSIS — R1110 Vomiting, unspecified: Secondary | ICD-10-CM | POA: Diagnosis not present

## 2024-08-11 DIAGNOSIS — E861 Hypovolemia: Secondary | ICD-10-CM | POA: Diagnosis present

## 2024-08-11 DIAGNOSIS — Z98891 History of uterine scar from previous surgery: Secondary | ICD-10-CM

## 2024-08-11 DIAGNOSIS — E876 Hypokalemia: Secondary | ICD-10-CM | POA: Diagnosis present

## 2024-08-11 DIAGNOSIS — R1030 Lower abdominal pain, unspecified: Secondary | ICD-10-CM | POA: Diagnosis not present

## 2024-08-11 DIAGNOSIS — Z975 Presence of (intrauterine) contraceptive device: Secondary | ICD-10-CM

## 2024-08-11 DIAGNOSIS — N7093 Salpingitis and oophoritis, unspecified: Principal | ICD-10-CM | POA: Diagnosis present

## 2024-08-11 DIAGNOSIS — D649 Anemia, unspecified: Secondary | ICD-10-CM | POA: Diagnosis present

## 2024-08-11 DIAGNOSIS — Z8249 Family history of ischemic heart disease and other diseases of the circulatory system: Secondary | ICD-10-CM | POA: Diagnosis not present

## 2024-08-11 DIAGNOSIS — N390 Urinary tract infection, site not specified: Secondary | ICD-10-CM | POA: Diagnosis present

## 2024-08-11 HISTORY — PX: IUD REMOVAL: OBO 1004

## 2024-08-11 LAB — URINALYSIS, ROUTINE W REFLEX MICROSCOPIC
Bilirubin Urine: NEGATIVE
Glucose, UA: NEGATIVE mg/dL
Hgb urine dipstick: NEGATIVE
Ketones, ur: 80 mg/dL — AB
Nitrite: NEGATIVE
Protein, ur: 100 mg/dL — AB
Specific Gravity, Urine: 1.028 (ref 1.005–1.030)
pH: 5 (ref 5.0–8.0)

## 2024-08-11 LAB — COMPREHENSIVE METABOLIC PANEL WITH GFR
ALT: 26 U/L (ref 0–44)
AST: 21 U/L (ref 15–41)
Albumin: 3.7 g/dL (ref 3.5–5.0)
Alkaline Phosphatase: 75 U/L (ref 38–126)
Anion gap: 13 (ref 5–15)
BUN: 7 mg/dL (ref 6–20)
CO2: 24 mmol/L (ref 22–32)
Calcium: 9.1 mg/dL (ref 8.9–10.3)
Chloride: 97 mmol/L — ABNORMAL LOW (ref 98–111)
Creatinine, Ser: 0.71 mg/dL (ref 0.44–1.00)
GFR, Estimated: >60 mL/min
Glucose, Bld: 89 mg/dL (ref 70–99)
Potassium: 3.5 mmol/L (ref 3.5–5.1)
Sodium: 134 mmol/L — ABNORMAL LOW (ref 135–145)
Total Bilirubin: 1 mg/dL (ref 0.0–1.2)
Total Protein: 7.9 g/dL (ref 6.5–8.1)

## 2024-08-11 LAB — CBC
HCT: 35.2 % — ABNORMAL LOW (ref 36.0–46.0)
Hemoglobin: 11.7 g/dL — ABNORMAL LOW (ref 12.0–15.0)
MCH: 29.3 pg (ref 26.0–34.0)
MCHC: 33.2 g/dL (ref 30.0–36.0)
MCV: 88.2 fL (ref 80.0–100.0)
Platelets: 354 K/uL (ref 150–400)
RBC: 3.99 MIL/uL (ref 3.87–5.11)
RDW: 12.1 % (ref 11.5–15.5)
WBC: 18.9 K/uL — ABNORMAL HIGH (ref 4.0–10.5)
nRBC: 0 % (ref 0.0–0.2)

## 2024-08-11 LAB — WET PREP, GENITAL
Sperm: NONE SEEN
Trich, Wet Prep: NONE SEEN
WBC, Wet Prep HPF POC: >=10 — AB (ref <10)
Yeast Wet Prep HPF POC: NONE SEEN

## 2024-08-11 LAB — POCT URINALYSIS DIP (MANUAL ENTRY)
Glucose, UA: NEGATIVE mg/dL
Nitrite, UA: NEGATIVE
Protein Ur, POC: 100 mg/dL — AB
Spec Grav, UA: >=1.03 — AB (ref 1.010–1.025)
Urobilinogen, UA: 1 U/dL
pH, UA: 6 (ref 5.0–8.0)

## 2024-08-11 LAB — HCG, SERUM, QUALITATIVE: Preg, Serum: NEGATIVE

## 2024-08-11 LAB — LIPASE, BLOOD: Lipase: 26 U/L (ref 11–51)

## 2024-08-11 LAB — POCT URINE PREGNANCY: Preg Test, Ur: NEGATIVE

## 2024-08-11 MED ORDER — SODIUM CHLORIDE 0.9 % IV SOLN
Freq: Once | INTRAVENOUS | Status: AC
Start: 2024-08-11 — End: 2024-08-11

## 2024-08-11 MED ORDER — SODIUM CHLORIDE 0.9 % IV SOLN: INTRAVENOUS | Status: DC

## 2024-08-11 MED ORDER — SODIUM CHLORIDE 0.9 % IV SOLN
1.0000 g | INTRAVENOUS | Status: DC
  Administered 2024-08-11 – 2024-08-14 (×4): 1 g via INTRAVENOUS
  Filled 2024-08-11 (×4): qty 10

## 2024-08-11 MED ORDER — ACETAMINOPHEN 325 MG PO TABS
650.0000 mg | ORAL_TABLET | Freq: Four times a day (QID) | ORAL | Status: DC | PRN
  Administered 2024-08-13 – 2024-08-15 (×4): 650 mg via ORAL
  Filled 2024-08-11 (×4): qty 2

## 2024-08-11 MED ORDER — IOHEXOL 350 MG/ML SOLN
75.0000 mL | Freq: Once | INTRAVENOUS | Status: AC | PRN
  Administered 2024-08-11: 75 mL via INTRAVENOUS

## 2024-08-11 MED ORDER — ONDANSETRON HCL 4 MG/2ML IJ SOLN
4.0000 mg | Freq: Once | INTRAMUSCULAR | Status: AC
  Administered 2024-08-11: 4 mg via INTRAVENOUS
  Filled 2024-08-11: qty 2

## 2024-08-11 MED ORDER — NALOXONE HCL 0.4 MG/ML IJ SOLN: 0.4000 mg | INTRAMUSCULAR | Status: DC | PRN

## 2024-08-11 MED ORDER — SODIUM CHLORIDE 0.9 % IV SOLN
100.0000 mg | Freq: Two times a day (BID) | INTRAVENOUS | Status: DC
  Administered 2024-08-11 – 2024-08-14 (×6): 100 mg via INTRAVENOUS
  Filled 2024-08-11 (×6): qty 100

## 2024-08-11 MED ORDER — ACETAMINOPHEN 650 MG RE SUPP: 650.0000 mg | Freq: Four times a day (QID) | RECTAL | Status: DC | PRN

## 2024-08-11 MED ORDER — SODIUM CHLORIDE 0.9 % IV BOLUS
1000.0000 mL | Freq: Once | INTRAVENOUS | Status: AC
  Administered 2024-08-11: 1000 mL via INTRAVENOUS

## 2024-08-11 MED ORDER — METRONIDAZOLE 500 MG/100ML IV SOLN
500.0000 mg | Freq: Two times a day (BID) | INTRAVENOUS | Status: DC
  Administered 2024-08-11 – 2024-08-14 (×6): 500 mg via INTRAVENOUS
  Filled 2024-08-11 (×6): qty 100

## 2024-08-11 MED ORDER — MORPHINE SULFATE (PF) 4 MG/ML IV SOLN
4.0000 mg | Freq: Once | INTRAVENOUS | Status: AC
  Administered 2024-08-11: 4 mg via INTRAVENOUS
  Filled 2024-08-11: qty 1

## 2024-08-11 MED ORDER — MORPHINE SULFATE (PF) 2 MG/ML IV SOLN
1.0000 mg | INTRAVENOUS | Status: DC | PRN
  Administered 2024-08-12 – 2024-08-13 (×2): 1 mg via INTRAVENOUS
  Filled 2024-08-11 (×2): qty 1

## 2024-08-11 MED ORDER — KETOROLAC TROMETHAMINE 15 MG/ML IJ SOLN: 15.0000 mg | Freq: Three times a day (TID) | INTRAMUSCULAR | Status: DC | PRN

## 2024-08-11 MED ORDER — KETOROLAC TROMETHAMINE 15 MG/ML IJ SOLN
15.0000 mg | Freq: Three times a day (TID) | INTRAMUSCULAR | Status: AC | PRN
  Administered 2024-08-11 – 2024-08-12 (×3): 15 mg via INTRAVENOUS
  Filled 2024-08-11 (×3): qty 1

## 2024-08-11 NOTE — H&P (Signed)
 History and Physical    Olivia Farrell FMW:[id number] DOB: 1991/08/22 DOA: 08/11/2024  PCP: Sherrilee Graven, MD  Patient coming from: Home  Chief Complaint: Abdominal pain  HPI: Olivia Farrell is a 33 y.o. female with medical history significant of anemia, preeclampsia presenting with a chief complaint of lower abdominal pain.  Patient is reporting 2-day history of severe pain across her lower abdomen.  She reports 1 episode of vomiting yesterday but no recurrence since then.  She reports having chills.  She has an IUD which was placed 11 years ago.  She is currently sexually active with one partner and states condoms are being used.  Denies noticing any vaginal discharge.  Denies dysuria or urinary frequency/urgency.  No other complaints.  ED Course: Vital signs stable.  Labs notable for WBC count 18.9, hemoglobin 11.7 (at baseline), MCV 88.2, sodium 134, chloride 97, serum hCG negative.  UA with 80 ketones, 100 protein, large amount of leukocytes, 6-10 RBCs, 21-50 WBCs, and rare bacteria.  Urine culture in process.  Blood cultures pending.  Pelvic exam done by ED PA positive for yellow colored vaginal discharge and cervical motion tenderness.  Wet prep showing clue cells and >10 WBCs.  GC chlamydia probe pending.  CT abdomen pelvis with contrast showing inflammatory process in the right lower quadrant with multiple cystic areas up to 2 cm suspicious for tubo-ovarian abscess versus possible ruptured appendicitis.  Pelvic ultrasound showing IUD but no acute findings.  ED PA discussed the case with OB/GYN (Dr. Izell) who recommended starting IV antibiotics including ceftriaxone, doxycycline, and Flagyl.  OB/GYN will consult.  Case was also discussed with general surgery (Dr. Polly) who agreed with IV antibiotics and not anticipating urgent surgery tonight.  General surgery will consult.  Patient was given morphine , Zofran , ceftriaxone, doxycycline, metronidazole, and 1 L normal saline  bolus.   Review of Systems:  Review of Systems  All other systems reviewed and are negative.   Past Medical History:  Diagnosis Date   Anemia    Preeclampsia 08/16/2012    Past Surgical History:  Procedure Laterality Date   CESAREAN SECTION  08/17/2012   Procedure: CESAREAN SECTION;  Surgeon: Nathanel LELON Bunker, MD;  Location: [facility];  Service: Obstetrics;  Laterality: N/A;     reports that she has never smoked. She has never used smokeless tobacco. She reports current alcohol use. She reports that she does not use drugs.  No Known Allergies  Family History  Problem Relation Age of Onset   Hypertension Mother    Diabetes Mother    Cancer Father     Prior to Admission medications   Medication Sig Start Date End Date Taking? Authorizing Provider  ibuprofen  (ADVIL ) 800 MG tablet Take 1 tablet (800 mg total) by mouth every 8 (eight) hours as needed (pain). 01/30/23   Banister, Pamela K, MD  levonorgestrel (MIRENA) 20 MCG/24HR IUD 1 each by Intrauterine route once.    [provider]    Physical Exam: Vitals:   08/11/24 1551 08/11/24 1610 08/11/24 2001  BP: (!) 115/57  114/71  Pulse: 97  99  Resp: 15  16  Temp: 98.5 F (36.9 C)  98.2 F (36.8 C)  TempSrc:   Oral  SpO2: 100%  100%  Weight:  49 kg   Height:  5' 1 (1.549 m)     Physical Exam Vitals reviewed.  Constitutional:      General: She is not in acute distress. HENT:     Head:  Normocephalic and atraumatic.  Eyes:     Extraocular Movements: Extraocular movements intact.  Cardiovascular:     Rate and Rhythm: Normal rate and regular rhythm.     Heart sounds: Normal heart sounds.  Pulmonary:     Effort: Pulmonary effort is normal. No respiratory distress.     Breath sounds: Normal breath sounds.  Abdominal:     General: Bowel sounds are normal. There is no distension.     Palpations: Abdomen is soft.     Tenderness: There is abdominal tenderness. There is no guarding.     Comments: Bilateral  lower quadrants tender to palpation  Musculoskeletal:     Cervical back: Normal range of motion.     Right lower leg: No edema.     Left lower leg: No edema.  Skin:    General: Skin is warm and dry.  Neurological:     General: No focal deficit present.     Mental Status: She is alert and oriented to person, place, and time.     Labs on Admission: I have personally reviewed following labs and imaging studies  CBC: Recent Labs  Lab 08/11/24 1629  WBC 18.9*  HGB 11.7*  HCT 35.2*  MCV 88.2  PLT 354   Basic Metabolic Panel: Recent Labs  Lab 08/11/24 1629  NA 134*  K 3.5  CL 97*  CO2 24  GLUCOSE 89  BUN 7  CREATININE 0.71  CALCIUM  9.1   GFR: Estimated Creatinine Clearance: 74.8 mL/min (by C-G formula based on SCr of 0.71 mg/dL). Liver Function Tests: Recent Labs  Lab 08/11/24 1629  AST 21  ALT 26  ALKPHOS 75  BILITOT 1.0  PROT 7.9  ALBUMIN 3.7   Recent Labs  Lab 08/11/24 1629  LIPASE 26   No results for input(s): AMMONIA in the last 168 hours. Coagulation Profile: No results for input(s): INR, PROTIME in the last 168 hours. Cardiac Enzymes: No results for input(s): CKTOTAL, CKMB, CKMBINDEX, TROPONINI in the last 168 hours. BNP (last 3 results) No results for input(s): PROBNP in the last 8760 hours. HbA1C: No results for input(s): HGBA1C in the last 72 hours. CBG: No results for input(s): GLUCAP in the last 168 hours. Lipid Profile: No results for input(s): CHOL, HDL, LDLCALC, TRIG, CHOLHDL, LDLDIRECT in the last 72 hours. Thyroid Function Tests: No results for input(s): TSH, T4TOTAL, FREET4, T3FREE, THYROIDAB in the last 72 hours. Anemia Panel: No results for input(s): VITAMINB12, FOLATE, FERRITIN, TIBC, IRON, RETICCTPCT in the last 72 hours. Urine analysis:    Component Value Date/Time   COLORURINE AMBER (A) 08/11/2024 1629   APPEARANCEUR CLOUDY (A) 08/11/2024 1629   LABSPEC 1.028 08/11/2024  1629   PHURINE 5.0 08/11/2024 1629   GLUCOSEU NEGATIVE 08/11/2024 1629   HGBUR NEGATIVE 08/11/2024 1629   BILIRUBINUR NEGATIVE 08/11/2024 1629   BILIRUBINUR small (A) 08/11/2024 1441   KETONESUR 80 (A) 08/11/2024 1629   KETONESUR large (80) (A) 08/11/2024 1441   PROTEINUR 100 (A) 08/11/2024 1629   PROTEINUR =100 (A) 08/11/2024 1441   UROBILINOGEN 1.0 08/11/2024 1441   UROBILINOGEN 1.0 06/07/2014 1947   NITRITE NEGATIVE 08/11/2024 1629   NITRITE Negative 08/11/2024 1441   LEUKOCYTESUR LARGE (A) 08/11/2024 1629   LEUKOCYTESUR Trace (A) 08/11/2024 1441    Radiological Exams on Admission: US  Pelvis Complete Addendum Date: 08/11/2024 ADDENDUM REPORT: 08/11/2024 20:43 ADDENDUM: Upon further evaluation an IUD is in place. This is confirmed on recent abdomen and pelvis CT (dated August 11, 2024). Electronically Signed  By: Suzen Dials M.D.   On: 08/11/2024 20:43   Result Date: 08/11/2024 CLINICAL DATA:  Pelvic pain. EXAM: TRANSABDOMINAL AND TRANSVAGINAL ULTRASOUND OF PELVIS DOPPLER ULTRASOUND OF OVARIES TECHNIQUE: Both transabdominal and transvaginal ultrasound examinations of the pelvis were performed. Transabdominal technique was performed for global imaging of the pelvis including uterus, ovaries, adnexal regions, and pelvic cul-de-sac. It was necessary to proceed with endovaginal exam following the transabdominal exam to visualize the uterus, endometrium, bilateral ovaries and bilateral adnexa. Color and duplex Doppler ultrasound was utilized to evaluate blood flow to the ovaries. COMPARISON:  None Available. FINDINGS: Uterus Measurements: 6.9 cm x 4.9 cm x 4.0 cm = volume: 79.0 mL. No fibroids or other mass visualized. Endometrium Thickness: 3.0 mm.  No focal abnormality visualized. Right ovary Measurements: 5.5 cm x 3.7 cm x 4.6 cm = volume: 47.7 mL. Normal appearance/no adnexal mass. Left ovary Measurements: 4.0 cm x 2.1 cm x 3.3 cm = volume: 14.9 mL. Normal appearance/no adnexal mass.  Pulsed Doppler evaluation of both ovaries demonstrates normal low-resistance arterial and venous waveforms. Other findings There is a small amount of pelvic free fluid, likely physiologic. The study is technically limited secondary to patient pain, as per the ultrasound technologist. IMPRESSION: Unremarkable pelvic ultrasound. Electronically Signed: By: Suzen Dials M.D. On: 08/11/2024 20:06   US  Transvaginal Non-OB Addendum Date: 08/11/2024 ADDENDUM REPORT: 08/11/2024 20:43 ADDENDUM: Upon further evaluation an IUD is in place. This is confirmed on recent abdomen and pelvis CT (dated August 11, 2024). Electronically Signed   By: Suzen Dials M.D.   On: 08/11/2024 20:43   Result Date: 08/11/2024 CLINICAL DATA:  Pelvic pain. EXAM: TRANSABDOMINAL AND TRANSVAGINAL ULTRASOUND OF PELVIS DOPPLER ULTRASOUND OF OVARIES TECHNIQUE: Both transabdominal and transvaginal ultrasound examinations of the pelvis were performed. Transabdominal technique was performed for global imaging of the pelvis including uterus, ovaries, adnexal regions, and pelvic cul-de-sac. It was necessary to proceed with endovaginal exam following the transabdominal exam to visualize the uterus, endometrium, bilateral ovaries and bilateral adnexa. Color and duplex Doppler ultrasound was utilized to evaluate blood flow to the ovaries. COMPARISON:  None Available. FINDINGS: Uterus Measurements: 6.9 cm x 4.9 cm x 4.0 cm = volume: 79.0 mL. No fibroids or other mass visualized. Endometrium Thickness: 3.0 mm.  No focal abnormality visualized. Right ovary Measurements: 5.5 cm x 3.7 cm x 4.6 cm = volume: 47.7 mL. Normal appearance/no adnexal mass. Left ovary Measurements: 4.0 cm x 2.1 cm x 3.3 cm = volume: 14.9 mL. Normal appearance/no adnexal mass. Pulsed Doppler evaluation of both ovaries demonstrates normal low-resistance arterial and venous waveforms. Other findings There is a small amount of pelvic free fluid, likely physiologic. The study is  technically limited secondary to patient pain, as per the ultrasound technologist. IMPRESSION: Unremarkable pelvic ultrasound. Electronically Signed: By: Suzen Dials M.D. On: 08/11/2024 20:06   US  Art/Ven Flow Abd Pelv Doppler Addendum Date: 08/11/2024 ADDENDUM REPORT: 08/11/2024 20:43 ADDENDUM: Upon further evaluation an IUD is in place. This is confirmed on recent abdomen and pelvis CT (dated August 11, 2024). Electronically Signed   By: Suzen Dials M.D.   On: 08/11/2024 20:43   Result Date: 08/11/2024 CLINICAL DATA:  Pelvic pain. EXAM: TRANSABDOMINAL AND TRANSVAGINAL ULTRASOUND OF PELVIS DOPPLER ULTRASOUND OF OVARIES TECHNIQUE: Both transabdominal and transvaginal ultrasound examinations of the pelvis were performed. Transabdominal technique was performed for global imaging of the pelvis including uterus, ovaries, adnexal regions, and pelvic cul-de-sac. It was necessary to proceed with endovaginal exam following the transabdominal exam  to visualize the uterus, endometrium, bilateral ovaries and bilateral adnexa. Color and duplex Doppler ultrasound was utilized to evaluate blood flow to the ovaries. COMPARISON:  None Available. FINDINGS: Uterus Measurements: 6.9 cm x 4.9 cm x 4.0 cm = volume: 79.0 mL. No fibroids or other mass visualized. Endometrium Thickness: 3.0 mm.  No focal abnormality visualized. Right ovary Measurements: 5.5 cm x 3.7 cm x 4.6 cm = volume: 47.7 mL. Normal appearance/no adnexal mass. Left ovary Measurements: 4.0 cm x 2.1 cm x 3.3 cm = volume: 14.9 mL. Normal appearance/no adnexal mass. Pulsed Doppler evaluation of both ovaries demonstrates normal low-resistance arterial and venous waveforms. Other findings There is a small amount of pelvic free fluid, likely physiologic. The study is technically limited secondary to patient pain, as per the ultrasound technologist. IMPRESSION: Unremarkable pelvic ultrasound. Electronically Signed: By: Suzen Dials M.D. On: 08/11/2024  20:06   CT ABDOMEN PELVIS W CONTRAST Result Date: 08/11/2024 EXAM: CT ABDOMEN AND PELVIS WITH CONTRAST 08/11/2024 06:07:58 PM TECHNIQUE: CT of the abdomen and pelvis was performed with the administration of 75 mL of iohexol (OMNIPAQUE) 350 MG/ML injection. Multiplanar reformatted images are provided for review. Automated exposure control, iterative reconstruction, and/or weight-based adjustment of the mA/kV was utilized to reduce the radiation dose to as low as reasonably achievable. COMPARISON: None available. CLINICAL HISTORY: Abdominal pain, acute, nonlocalized. FINDINGS: LOWER CHEST: No acute abnormality. LIVER: The liver is unremarkable. GALLBLADDER AND BILE DUCTS: Gallbladder is unremarkable. No biliary ductal dilatation. SPLEEN: No acute abnormality. PANCREAS: No acute abnormality. ADRENAL GLANDS: No acute abnormality. KIDNEYS, URETERS AND BLADDER: No stones in the kidneys or ureters. No hydronephrosis. No perinephric or periureteral stranding. Urinary bladder is unremarkable. GI AND BOWEL: Stomach demonstrates no acute abnormality. There is no bowel obstruction. Appendix cannot be definitively visualized PERITONEUM AND RETROPERITONEUM: Small amount of free fluid in the pelvis. No free air. VASCULATURE: Aorta is normal in caliber. LYMPH NODES: No lymphadenopathy. REPRODUCTIVE ORGANS: Inflammatory process in the right lower quadrant adjacent to the right side of the uterus, containing multiple cystic areas, the largest 2 cm. This process seems to be closer to the uterus than the cecum and ileocecal valve; therefore, an ovarian process, possibly a tubo-ovarian abscess, is favored. An IUD is present in the uterus. BONES AND SOFT TISSUES: No acute osseous abnormality. No focal soft tissue abnormality. IMPRESSION: 1. Inflammatory process in the right lower quadrant with multiple cystic areas up to 2 cm, favored ovarian in origin such as a tubo-ovarian abscess; ruptured appendicitis cannot be excluded due to  nonvisualization of the appendix. 2. Small amount of free pelvic fluid. Electronically signed by: Franky Crease MD 08/11/2024 06:24 PM EDT RP Workstation: [id number]    Assessment and Plan  Lower abdominal pain Bacterial vaginosis ?PID/tubo-ovarian abscess versus ?ruptured appendicitis Patient is presenting with 2-day history of lower abdominal pain.  WBC count 18.9.  Afebrile and vital signs stable.  Pelvic exam done by ED PA positive for yellow colored vaginal discharge and cervical motion tenderness.  Wet prep showing clue cells and >10 WBCs.  GC chlamydia probe pending.  CT abdomen pelvis with contrast showing inflammatory process in the right lower quadrant with multiple cystic areas up to 2 cm suspicious for tubo-ovarian abscess versus possible ruptured appendicitis.  Pelvic ultrasound showing IUD but no acute findings.  OB/GYN and general surgery consulted.  I have spoken to OB/GYN Dr. Izell who evaluated the patient in the ED and upon his exam did not appreciate cervical motion tenderness.  He is  planning on removing the patient's IUD tonight as it has been present for 11 years.  Recommending continuing IV antibiotics for now including ceftriaxone, doxycycline, and metronidazole.  Follow-up GC chlamydia probe and blood cultures.  Trend WBC count.  Continue pain management.  ?UTI UA with evidence of pyuria and bacteria but patient is not endorsing any urinary symptoms.  Already started on ceftriaxone.  Follow-up urine culture.  Mild hypovolemic hyponatremia Suspect dehydration as UA with ketones.  Patient received 1 L normal saline bolus in the ED.  Continue maintenance IV fluids (normal saline) and monitor sodium level.  Chronic normocytic anemia Hemoglobin at baseline, monitor CBC.  DVT prophylaxis: SCDs Code Status: Full Code (discussed with the patient) Level of care: Telemetry bed Admission status: It is my clinical opinion that admission to INPATIENT is reasonable and necessary  because of the expectation that this patient will require hospital care that crosses at least 2 midnights to treat this condition based on the medical complexity of the problems presented.  Given the aforementioned information, the predictability of an adverse outcome is felt to be significant.  Editha Ram MD [facility]  If 7PM-7AM, please contact night-coverage [email or web address]  08/11/2024, 9:12 PM

## 2024-08-11 NOTE — Consult Note (Signed)
 Gynecology Consult note  Date of Consult: 08/11/2024   Requesting Provider: [facility]  Primary OBGYN: None  Reason for Consult: concern for 2cm right sided TOA  History of Present Illness: Olivia Farrell is a 33 y.o. G1P0101 (No LMP recorded. (Menstrual status: IUD).), with the above CC. PMHx is significant for h/o c-section x 1 and Mirena IUD in place for the past 11 years.  Patient doesn't have periods with the Mirena. She states that starting two days ago, she's had persistent RLQ pain. No prior history and no diarrhea, constipation, fevers. +chills, nausea yesterday with vomiting x 1 yesterday but none today. She just had some ice and water and just did well with that.   ED work with CT that showed IUD appearing to be in proper location and 2cm inflammatory process adjacent to the right uterus with multiple cystic areas with GYN process favored, appendix not seen but ruptured appendicitis can't be ruled out. Pelvic u/s doesn't show anything in the right adnexa. IUD in appropriate uterine location.   ROS: A 12-point review of systems was performed and negative, except as stated in the above HPI.  OBGYN History: As per HPI. OB History  Gravida Para Term Preterm AB Living  1 1 0 1 0 1  SAB IAB Ectopic Multiple Live Births  0 0 0  1    # Outcome Date GA Lbr Len/2nd Weight Sex Type Anes PTL Lv  1 Preterm 08/17/12 [redacted]w[redacted]d  1730 g F CS-LTranv Spinal  LIV     Birth Comments: Newborn screen needs to be repeated due filter paper rubbed up.  Mom aware and is having repeated this week (12/4-12/6) 09/14/2012 Newborn screen was redone. Hgb, Normal, FA   History of pap smears: Yes. Last pap smear unknown History of STIs: No  Past Medical History: Past Medical History:  Diagnosis Date   Anemia    Preeclampsia 08/16/2012   Past Surgical History: Past Surgical History:  Procedure Laterality Date   CESAREAN SECTION  08/17/2012   Procedure: CESAREAN SECTION;  Surgeon: Nathanel LELON Bunker,  MD;  Location: [facility];  Service: Obstetrics;  Laterality: N/A;   Family History:  Family History  Problem Relation Age of Onset   Hypertension Mother    Diabetes Mother    Cancer Father    Social History:  Social History   Socioeconomic History   Marital status: Single    Spouse name: Not on file   Number of children: Not on file   Years of education: Not on file   Highest education level: Not on file  Occupational History   Not on file  Tobacco Use   Smoking status: Never   Smokeless tobacco: Never  Vaping Use   Vaping status: Never Used  Substance and Sexual Activity   Alcohol use: Yes    Comment: rarely   Drug use: No   Sexual activity: Yes    Birth control/protection: I.U.D.  Other Topics Concern   Not on file  Social History Narrative   ** Merged History Encounter **       Social Drivers of Corporate Investment Banker Strain: Not on file  Food Insecurity: Not on file  Transportation Needs: Not on file  Physical Activity: Not on file  Stress: Not on file  Social Connections: Not on file  Intimate Partner Violence: Not on file   Allergy: No Known Allergies  Current Outpatient Medications: None  Hospital Medications: Current Facility-Administered Medications  Medication Dose Route Frequency  Provider Last Rate Last Admin   cefTRIAXone (ROCEPHIN) 1 g in sodium chloride  0.9 % 100 mL IVPB  1 g Intravenous Q24H Barrett, Jamie N, PA-C 200 mL/hr at 08/11/24 2027 1 g at 08/11/24 2027   doxycycline (VIBRAMYCIN) 100 mg in sodium chloride  0.9 % 250 mL IVPB  100 mg Intravenous Q12H Barrett, Jamie N, PA-C       metroNIDAZOLE (FLAGYL) IVPB 500 mg  500 mg Intravenous Q12H Barrett, Jamie N, PA-C       Current Outpatient Medications  Medication Sig Dispense Refill   ibuprofen  (ADVIL ) 800 MG tablet Take 1 tablet (800 mg total) by mouth every 8 (eight) hours as needed (pain). 21 tablet 0   levonorgestrel (MIRENA) 20 MCG/24HR IUD 1 each by Intrauterine route once.       Physical Exam: Patient Vitals for the past 24 hrs:  BP Temp Temp src Pulse Resp SpO2 Height Weight  08/11/24 2001 114/71 98.2 F (36.8 C) Oral 99 16 100 % -- --  08/11/24 1610 -- -- -- -- -- -- 5' 1 (1.549 m) 49 kg  08/11/24 1551 (!) 115/57 98.5 F (36.9 C) -- 97 15 100 % -- --   Body mass index is 20.41 kg/m. General appearance: Well nourished, well developed female in no acute distress.  Cardiovascular: S1, S2 normal, no murmur, rub or gallop, regular rate and rhythm Respiratory:  Clear to auscultation bilateral. Normal respiratory effort Abdomen: positive bowel sounds and no masses, hernias; diffusely non tender to palpation, non distended Neuro/Psych:  Normal mood and affect.  Skin:  Warm and dry.  Extremities: no clubbing, cyanosis, or edema.   Pelvic exam: see iud removal procedure note but exam only remarkable for no cmt but with yellowish malodorous discharge  Laboratory: Pending: urine culture, gc/ct Wet prep: +clue cells, +WBCs Qualitative hcg: negative  Recent Labs  Lab 08/11/24 1629  WBC 18.9*  HGB 11.7*  HCT 35.2*  PLT 354   Recent Labs  Lab 08/11/24 1629  NA 134*  K 3.5  CL 97*  CO2 24  BUN 7  CREATININE 0.71  CALCIUM  9.1  PROT 7.9  BILITOT 1.0  ALKPHOS 75  ALT 26  AST 21  GLUCOSE 89   Imaging:  Narrative & Impression  EXAM: CT ABDOMEN AND PELVIS WITH CONTRAST 08/11/2024 06:07:58 PM   TECHNIQUE: CT of the abdomen and pelvis was performed with the administration of 75 mL of iohexol (OMNIPAQUE) 350 MG/ML injection. Multiplanar reformatted images are provided for review. Automated exposure control, iterative reconstruction, and/or weight-based adjustment of the mA/kV was utilized to reduce the radiation dose to as low as reasonably achievable.   COMPARISON: None available.   CLINICAL HISTORY: Abdominal pain, acute, nonlocalized.   FINDINGS:   LOWER CHEST: No acute abnormality.   LIVER: The liver is unremarkable.    GALLBLADDER AND BILE DUCTS: Gallbladder is unremarkable. No biliary ductal dilatation.   SPLEEN: No acute abnormality.   PANCREAS: No acute abnormality.   ADRENAL GLANDS: No acute abnormality.   KIDNEYS, URETERS AND BLADDER: No stones in the kidneys or ureters. No hydronephrosis. No perinephric or periureteral stranding. Urinary bladder is unremarkable.   GI AND BOWEL: Stomach demonstrates no acute abnormality. There is no bowel obstruction. Appendix cannot be definitively visualized   PERITONEUM AND RETROPERITONEUM: Small amount of free fluid in the pelvis. No free air.   VASCULATURE: Aorta is normal in caliber.   LYMPH NODES: No lymphadenopathy.   REPRODUCTIVE ORGANS: Inflammatory process in the right lower  quadrant adjacent to the right side of the uterus, containing multiple cystic areas, the largest 2 cm. This process seems to be closer to the uterus than the cecum and ileocecal valve; therefore, an ovarian process, possibly a tubo-ovarian abscess, is favored. An IUD is present in the uterus.   BONES AND SOFT TISSUES: No acute osseous abnormality. No focal soft tissue abnormality.   IMPRESSION: 1. Inflammatory process in the right lower quadrant with multiple cystic areas up to 2 cm, favored ovarian in origin such as a tubo-ovarian abscess; ruptured appendicitis cannot be excluded due to nonvisualization of the appendix. 2. Small amount of free pelvic fluid.   Electronically signed by: Franky Crease MD 08/11/2024 06:24 PM EDT RP Workstation: [id number]   ADDENDUM REPORT: 08/11/2024 20:43   ADDENDUM: Upon further evaluation an IUD is in place. This is confirmed on recent abdomen and pelvis CT (dated August 11, 2024).     Electronically Signed   By: Suzen Dials M.D.   On: 08/11/2024 20:43    Addended by Dials Suzen BIRCH, MD on 08/11/2024  8:45 PM    Study Result  Narrative & Impression  CLINICAL DATA:  Pelvic pain.   EXAM: TRANSABDOMINAL  AND TRANSVAGINAL ULTRASOUND OF PELVIS   DOPPLER ULTRASOUND OF OVARIES   TECHNIQUE: Both transabdominal and transvaginal ultrasound examinations of the pelvis were performed. Transabdominal technique was performed for global imaging of the pelvis including uterus, ovaries, adnexal regions, and pelvic cul-de-sac.   It was necessary to proceed with endovaginal exam following the transabdominal exam to visualize the uterus, endometrium, bilateral ovaries and bilateral adnexa. Color and duplex Doppler ultrasound was utilized to evaluate blood flow to the ovaries.   COMPARISON:  None Available.   FINDINGS: Uterus   Measurements: 6.9 cm x 4.9 cm x 4.0 cm = volume: 79.0 mL. No fibroids or other mass visualized.   Endometrium   Thickness: 3.0 mm.  No focal abnormality visualized.   Right ovary   Measurements: 5.5 cm x 3.7 cm x 4.6 cm = volume: 47.7 mL. Normal appearance/no adnexal mass.   Left ovary   Measurements: 4.0 cm x 2.1 cm x 3.3 cm = volume: 14.9 mL. Normal appearance/no adnexal mass.   Pulsed Doppler evaluation of both ovaries demonstrates normal low-resistance arterial and venous waveforms.   Other findings   There is a small amount of pelvic free fluid, likely physiologic.   The study is technically limited secondary to patient pain, as per the ultrasound technologist.   IMPRESSION: Unremarkable pelvic ultrasound.   Electronically Signed: By: Suzen Dials M.D. On: 08/11/2024 20:06    Assessment: patient stable  Plan: D/w her and unlikely that the old Mirena is causing her s/s but given that it's 34 years old, I told her I recommend removing it which was done in the ED. D/w Hospitalist team who will admit. Gen Surg agrees with abx only management for now I told Hospitalist team that if wbc is significantly improved tomorrow and pt can take PO abx (doxy and flagyl) then she would likely be fine for discharge to home from our standpoint late tomorrow or  Monday and continue on abx for at least 2-3 weeks or if repeat imaging is done first as an outpatient.  Add on rpr and hiv ordered Follow up std swabs [facility] for gyn appt for later this upcoming week  GYN will see tomorrow.   Total time taking care of the patient was 36  minutes, with greater than 50% of the time spent in face to face interaction with the patient.  Olivia Izell Raddle MD Attending Center for [facility] GYN Consult Phone: [phone] (M-F, [phone]) & [phone] (Off hours, weekends, holidays)\

## 2024-08-11 NOTE — ED Triage Notes (Addendum)
 Pt came in via POV d/t abd pain the last 2 days & started vomiting today. States that she went to [facility] & was told she is dehydrated & recommended to come into ED for eval. Describes her lower abd pain as a 8/10 cramping & makes her feel diaphoretic.

## 2024-08-11 NOTE — Consult Note (Signed)
 Olivia Farrell Olivia Farrell [birth year]  [id number].    Requesting MD: [place] Chief Complaint/Reason for Consult: ?appendicitis  HPI:  33 y/o F who presented to the ED with abdominal pain and general malaise. Labs notable for a WBC 19. She underwent a CT that showed inflammation within the RLQ with multiple cysts. The appendix is not visualized. A follow up ultrasound was unremarkable. A UA was positive and per evaluation by EDP she has vaginal discharge and cervical motion tenderness.   On exam, patient is resting comfortably. She denies previous pain like this. Only surgical history includes c-section.   ROS: ROS  Family History  Problem Relation Age of Onset   Hypertension Mother    Diabetes Mother    Cancer Father     Past Medical History:  Diagnosis Date   Anemia    Preeclampsia 08/16/2012    Past Surgical History:  Procedure Laterality Date   CESAREAN SECTION  08/17/2012   Procedure: CESAREAN SECTION;  Surgeon: Nathanel LELON Bunker, MD;  Location: [facility];  Service: Obstetrics;  Laterality: N/A;    Social History:  reports that she has never smoked. She has never used smokeless tobacco. She reports current alcohol use. She reports that she does not use drugs.  Allergies: No Known Allergies  (Not in a hospital admission)   Physical Exam: Blood pressure 114/71, pulse 99, temperature 98.2 F (36.8 C), temperature source Oral, resp. rate 16, height 5' 1 (1.549 m), weight 49 kg, SpO2 100%. Gen: female, NAD Abd: soft, non-distended, TTP in the suprapubic and RLQ, no rebound/guarding, no peritoneal signs  Results for orders placed or performed during the hospital encounter of 08/11/24 (from the past 48 hours)  Lipase, blood     Status: None   Collection Time: 08/11/24  4:29 PM  Result Value Ref Range   Lipase 26 11 - 51 U/L    Comment: Performed at [facility], [address], [place], [place] [address]  Comprehensive metabolic panel     Status: Abnormal    Collection Time: 08/11/24  4:29 PM  Result Value Ref Range   Sodium 134 (L) 135 - 145 mmol/L   Potassium 3.5 3.5 - 5.1 mmol/L   Chloride 97 (L) 98 - 111 mmol/L   CO2 24 22 - 32 mmol/L   Glucose, Bld 89 70 - 99 mg/dL    Comment: Glucose reference range applies only to samples taken after fasting for at least 8 hours.   BUN 7 6 - 20 mg/dL   Creatinine, Ser 9.28 0.44 - 1.00 mg/dL   Calcium  9.1 8.9 - 10.3 mg/dL   Total Protein 7.9 6.5 - 8.1 g/dL   Albumin 3.7 3.5 - 5.0 g/dL   AST 21 15 - 41 U/L   ALT 26 0 - 44 U/L   Alkaline Phosphatase 75 38 - 126 U/L   Total Bilirubin 1.0 0.0 - 1.2 mg/dL   GFR, Estimated >39 >39 mL/min    Comment: (NOTE) Calculated using the CKD-EPI Creatinine Equation (2021)    Anion gap 13 5 - 15    Comment: Performed at [facility], [address], [place], [place] [address]  CBC     Status: Abnormal   Collection Time: 08/11/24  4:29 PM  Result Value Ref Range   WBC 18.9 (H) 4.0 - 10.5 K/uL   RBC 3.99 3.87 - 5.11 MIL/uL   Hemoglobin 11.7 (L) 12.0 - 15.0 g/dL   HCT 64.7 (L) 63.9 -  46.0 %   MCV 88.2 80.0 - 100.0 fL   MCH 29.3 26.0 - 34.0 pg   MCHC 33.2 30.0 - 36.0 g/dL   RDW 87.8 88.4 - 84.4 %   Platelets 354 150 - 400 K/uL   nRBC 0.0 0.0 - 0.2 %    Comment: Performed at [facility], [address] [place], [place] [address]  Urinalysis, Routine w reflex microscopic -Urine, Clean Catch     Status: Abnormal   Collection Time: 08/11/24  4:29 PM  Result Value Ref Range   Color, Urine AMBER (A) YELLOW    Comment: BIOCHEMICALS MAY BE AFFECTED BY COLOR   APPearance CLOUDY (A) CLEAR   Specific Gravity, Urine 1.028 1.005 - 1.030   pH 5.0 5.0 - 8.0   Glucose, UA NEGATIVE NEGATIVE mg/dL   Hgb urine dipstick NEGATIVE NEGATIVE   Bilirubin Urine NEGATIVE NEGATIVE   Ketones, ur 80 (A) NEGATIVE mg/dL   Protein, ur 899 (A) NEGATIVE mg/dL   Nitrite NEGATIVE NEGATIVE   Leukocytes,Ua LARGE (A) NEGATIVE   RBC / HPF 6-10 0 - 5 RBC/hpf   WBC, UA 21-50 0 -  5 WBC/hpf   Bacteria, UA RARE (A) NONE SEEN   Squamous Epithelial / HPF 6-10 0 - 5 /HPF   Mucus PRESENT    Hyaline Casts, UA PRESENT     Comment: Performed at [facility], [address], [place], [place] [address]  hCG, serum, qualitative     Status: None   Collection Time: 08/11/24  4:29 PM  Result Value Ref Range   Preg, Serum NEGATIVE NEGATIVE    Comment:        THE SENSITIVITY OF THIS METHODOLOGY IS >10 mIU/mL. Performed at [facility], [address], [place], [place] [address]   Wet prep, genital     Status: Abnormal   Collection Time: 08/11/24  6:55 PM  Result Value Ref Range   Yeast Wet Prep HPF POC NONE SEEN NONE SEEN   Trich, Wet Prep NONE SEEN NONE SEEN   Clue Cells Wet Prep HPF POC PRESENT (A) NONE SEEN   WBC, Wet Prep HPF POC >=10 (A) <10   Sperm NONE SEEN     Comment: Performed at [facility], [address], [place], [place] [address]   US  Pelvis Complete Addendum Date: 08/11/2024 ADDENDUM REPORT: 08/11/2024 20:43 ADDENDUM: Upon further evaluation an IUD is in place. This is confirmed on recent abdomen and pelvis CT (dated August 11, 2024). Electronically Signed   By: Suzen Dials M.D.   On: 08/11/2024 20:43   Result Date: 08/11/2024 CLINICAL DATA:  Pelvic pain. EXAM: TRANSABDOMINAL AND TRANSVAGINAL ULTRASOUND OF PELVIS DOPPLER ULTRASOUND OF OVARIES TECHNIQUE: Both transabdominal and transvaginal ultrasound examinations of the pelvis were performed. Transabdominal technique was performed for global imaging of the pelvis including uterus, ovaries, adnexal regions, and pelvic cul-de-sac. It was necessary to proceed with endovaginal exam following the transabdominal exam to visualize the uterus, endometrium, bilateral ovaries and bilateral adnexa. Color and duplex Doppler ultrasound was utilized to evaluate blood flow to the ovaries. COMPARISON:  None Available. FINDINGS: Uterus Measurements: 6.9 cm x 4.9 cm x 4.0 cm = volume: 79.0 mL. No fibroids  or other mass visualized. Endometrium Thickness: 3.0 mm.  No focal abnormality visualized. Right ovary Measurements: 5.5 cm x 3.7 cm x 4.6 cm = volume: 47.7 mL. Normal appearance/no adnexal mass. Left ovary Measurements: 4.0 cm x 2.1 cm x 3.3 cm =  volume: 14.9 mL. Normal appearance/no adnexal mass. Pulsed Doppler evaluation of both ovaries demonstrates normal low-resistance arterial and venous waveforms. Other findings There is a small amount of pelvic free fluid, likely physiologic. The study is technically limited secondary to patient pain, as per the ultrasound technologist. IMPRESSION: Unremarkable pelvic ultrasound. Electronically Signed: By: Suzen Dials M.D. On: 08/11/2024 20:06   US  Transvaginal Non-OB Addendum Date: 08/11/2024 ADDENDUM REPORT: 08/11/2024 20:43 ADDENDUM: Upon further evaluation an IUD is in place. This is confirmed on recent abdomen and pelvis CT (dated August 11, 2024). Electronically Signed   By: Suzen Dials M.D.   On: 08/11/2024 20:43   Result Date: 08/11/2024 CLINICAL DATA:  Pelvic pain. EXAM: TRANSABDOMINAL AND TRANSVAGINAL ULTRASOUND OF PELVIS DOPPLER ULTRASOUND OF OVARIES TECHNIQUE: Both transabdominal and transvaginal ultrasound examinations of the pelvis were performed. Transabdominal technique was performed for global imaging of the pelvis including uterus, ovaries, adnexal regions, and pelvic cul-de-sac. It was necessary to proceed with endovaginal exam following the transabdominal exam to visualize the uterus, endometrium, bilateral ovaries and bilateral adnexa. Color and duplex Doppler ultrasound was utilized to evaluate blood flow to the ovaries. COMPARISON:  None Available. FINDINGS: Uterus Measurements: 6.9 cm x 4.9 cm x 4.0 cm = volume: 79.0 mL. No fibroids or other mass visualized. Endometrium Thickness: 3.0 mm.  No focal abnormality visualized. Right ovary Measurements: 5.5 cm x 3.7 cm x 4.6 cm = volume: 47.7 mL. Normal appearance/no adnexal mass. Left  ovary Measurements: 4.0 cm x 2.1 cm x 3.3 cm = volume: 14.9 mL. Normal appearance/no adnexal mass. Pulsed Doppler evaluation of both ovaries demonstrates normal low-resistance arterial and venous waveforms. Other findings There is a small amount of pelvic free fluid, likely physiologic. The study is technically limited secondary to patient pain, as per the ultrasound technologist. IMPRESSION: Unremarkable pelvic ultrasound. Electronically Signed: By: Suzen Dials M.D. On: 08/11/2024 20:06   US  Art/Ven Flow Abd Pelv Doppler Addendum Date: 08/11/2024 ADDENDUM REPORT: 08/11/2024 20:43 ADDENDUM: Upon further evaluation an IUD is in place. This is confirmed on recent abdomen and pelvis CT (dated August 11, 2024). Electronically Signed   By: Suzen Dials M.D.   On: 08/11/2024 20:43   Result Date: 08/11/2024 CLINICAL DATA:  Pelvic pain. EXAM: TRANSABDOMINAL AND TRANSVAGINAL ULTRASOUND OF PELVIS DOPPLER ULTRASOUND OF OVARIES TECHNIQUE: Both transabdominal and transvaginal ultrasound examinations of the pelvis were performed. Transabdominal technique was performed for global imaging of the pelvis including uterus, ovaries, adnexal regions, and pelvic cul-de-sac. It was necessary to proceed with endovaginal exam following the transabdominal exam to visualize the uterus, endometrium, bilateral ovaries and bilateral adnexa. Color and duplex Doppler ultrasound was utilized to evaluate blood flow to the ovaries. COMPARISON:  None Available. FINDINGS: Uterus Measurements: 6.9 cm x 4.9 cm x 4.0 cm = volume: 79.0 mL. No fibroids or other mass visualized. Endometrium Thickness: 3.0 mm.  No focal abnormality visualized. Right ovary Measurements: 5.5 cm x 3.7 cm x 4.6 cm = volume: 47.7 mL. Normal appearance/no adnexal mass. Left ovary Measurements: 4.0 cm x 2.1 cm x 3.3 cm = volume: 14.9 mL. Normal appearance/no adnexal mass. Pulsed Doppler evaluation of both ovaries demonstrates normal low-resistance arterial and  venous waveforms. Other findings There is a small amount of pelvic free fluid, likely physiologic. The study is technically limited secondary to patient pain, as per the ultrasound technologist. IMPRESSION: Unremarkable pelvic ultrasound. Electronically Signed: By: Suzen Dials M.D. On: 08/11/2024 20:06   CT ABDOMEN PELVIS W CONTRAST Result Date: 08/11/2024 EXAM: CT  ABDOMEN AND PELVIS WITH CONTRAST 08/11/2024 06:07:58 PM TECHNIQUE: CT of the abdomen and pelvis was performed with the administration of 75 mL of iohexol (OMNIPAQUE) 350 MG/ML injection. Multiplanar reformatted images are provided for review. Automated exposure control, iterative reconstruction, and/or weight-based adjustment of the mA/kV was utilized to reduce the radiation dose to as low as reasonably achievable. COMPARISON: None available. CLINICAL HISTORY: Abdominal pain, acute, nonlocalized. FINDINGS: LOWER CHEST: No acute abnormality. LIVER: The liver is unremarkable. GALLBLADDER AND BILE DUCTS: Gallbladder is unremarkable. No biliary ductal dilatation. SPLEEN: No acute abnormality. PANCREAS: No acute abnormality. ADRENAL GLANDS: No acute abnormality. KIDNEYS, URETERS AND BLADDER: No stones in the kidneys or ureters. No hydronephrosis. No perinephric or periureteral stranding. Urinary bladder is unremarkable. GI AND BOWEL: Stomach demonstrates no acute abnormality. There is no bowel obstruction. Appendix cannot be definitively visualized PERITONEUM AND RETROPERITONEUM: Small amount of free fluid in the pelvis. No free air. VASCULATURE: Aorta is normal in caliber. LYMPH NODES: No lymphadenopathy. REPRODUCTIVE ORGANS: Inflammatory process in the right lower quadrant adjacent to the right side of the uterus, containing multiple cystic areas, the largest 2 cm. This process seems to be closer to the uterus than the cecum and ileocecal valve; therefore, an ovarian process, possibly a tubo-ovarian abscess, is favored. An IUD is present in the  uterus. BONES AND SOFT TISSUES: No acute osseous abnormality. No focal soft tissue abnormality. IMPRESSION: 1. Inflammatory process in the right lower quadrant with multiple cystic areas up to 2 cm, favored ovarian in origin such as a tubo-ovarian abscess; ruptured appendicitis cannot be excluded due to nonvisualization of the appendix. 2. Small amount of free pelvic fluid. Electronically signed by: Franky Crease MD 08/11/2024 06:24 PM EDT RP Workstation: [id number]    Assessment/Plan 33 y/o F with abdominal pain, malaise, and a leukocytosis  - Her exam, history, and lab work is most consistent with a gynecologic etiology for her symptoms and imaging, however, the appendix is not visualized and appendicitis cannot be ruled out. - No indication for surgical intervention at this time - Agree with admission to medicine - IV abx - Appreciate OB eval and recs - Surgery will follow for now  Cordella DELENA Polly Marlis Cheron Surgery 08/11/2024, 9:13 PM Please see Amion for pager number during day hours 7:00am-4:30pm or 7:00am -11:30am on weekends

## 2024-08-11 NOTE — ED Triage Notes (Signed)
 Pt states lower abdominal pain for the past 2 days.  States she vomited x1 yesterday. States she took a laxative and had a small BM today.

## 2024-08-11 NOTE — ED Notes (Signed)
 Patient transported to [place]

## 2024-08-11 NOTE — ED Provider Notes (Signed)
 [place] EMERGENCY DEPARTMENT AT [facility] Provider Note   CSN: [id number] Arrival date & time: 08/11/24  1546     Patient presents with: Abdominal Pain and Emesis   Olivia Farrell is a 33 y.o. female patient with past medical history of C-section in 2013 is presenting to emergency room with complaint of abdominal pain.  Patient notes that she has had 2 days of lower abdominal pain worse on the left side.  This is associated with nausea and 1 episode of vomiting.  She also feels quite unwell.  She denies any fevers chills or change in bowel movements.  She reports she is sexually active with 1 partner and they do not use protection.  She denies any urinary symptoms and she denies any vaginal discharge.  She does have IUD.    Abdominal Pain Associated symptoms: vomiting   Emesis Associated symptoms: abdominal pain        Prior to Admission medications   Medication Sig Start Date End Date Taking? Authorizing Provider  ibuprofen  (ADVIL ) 800 MG tablet Take 1 tablet (800 mg total) by mouth every 8 (eight) hours as needed (pain). 01/30/23   Banister, Pamela K, MD  levonorgestrel (MIRENA) 20 MCG/24HR IUD 1 each by Intrauterine route once.    [provider]    Allergies: Patient has no known allergies.    Review of Systems  Gastrointestinal:  Positive for abdominal pain and vomiting.    Updated Vital Signs BP (!) 115/57 (BP Location: Right Arm)   Pulse 97   Temp 98.5 F (36.9 C)   Resp 15   Ht 5' 1 (1.549 m)   Wt 49 kg   SpO2 100%   BMI 20.41 kg/m   Physical Exam Vitals and nursing note reviewed.  Constitutional:      General: She is not in acute distress.    Appearance: She is not toxic-appearing.  HENT:     Head: Normocephalic and atraumatic.  Eyes:     General: No scleral icterus.    Conjunctiva/sclera: Conjunctivae normal.  Cardiovascular:     Rate and Rhythm: Normal rate and regular rhythm.     Pulses: Normal pulses.     Heart  sounds: Normal heart sounds.  Pulmonary:     Effort: Pulmonary effort is normal. No respiratory distress.     Breath sounds: Normal breath sounds.  Abdominal:     General: Abdomen is flat. Bowel sounds are normal.     Palpations: Abdomen is soft.     Tenderness: There is abdominal tenderness in the right lower quadrant, suprapubic area and left lower quadrant. There is no right CVA tenderness or left CVA tenderness.  Genitourinary:    Comments: Pelvic exam with chaperone Sophia Tech.  Positive for significant cervical motion tenderness on exam.  Yellow tinged discharge. IUD string visible.  Skin:    General: Skin is warm and dry.     Findings: No lesion.  Neurological:     General: No focal deficit present.     Mental Status: She is alert and oriented to person, place, and time. Mental status is at baseline.     (all labs ordered are listed, but only abnormal results are displayed) Labs Reviewed  COMPREHENSIVE METABOLIC PANEL WITH GFR - Abnormal; Notable for the following components:      Result Value   Sodium 134 (*)    Chloride 97 (*)    All other components within normal limits  CBC - Abnormal; Notable for  the following components:   WBC 18.9 (*)    Hemoglobin 11.7 (*)    HCT 35.2 (*)    All other components within normal limits  URINALYSIS, ROUTINE W REFLEX MICROSCOPIC - Abnormal; Notable for the following components:   Color, Urine AMBER (*)    APPearance CLOUDY (*)    Ketones, ur 80 (*)    Protein, ur 100 (*)    Leukocytes,Ua LARGE (*)    Bacteria, UA RARE (*)    All other components within normal limits  URINE CULTURE  LIPASE, BLOOD  HCG, SERUM, QUALITATIVE    EKG: None  Radiology: CT ABDOMEN PELVIS W CONTRAST Result Date: 08/11/2024 EXAM: CT ABDOMEN AND PELVIS WITH CONTRAST 08/11/2024 06:07:58 PM TECHNIQUE: CT of the abdomen and pelvis was performed with the administration of 75 mL of iohexol (OMNIPAQUE) 350 MG/ML injection. Multiplanar reformatted images  are provided for review. Automated exposure control, iterative reconstruction, and/or weight-based adjustment of the mA/kV was utilized to reduce the radiation dose to as low as reasonably achievable. COMPARISON: None available. CLINICAL HISTORY: Abdominal pain, acute, nonlocalized. FINDINGS: LOWER CHEST: No acute abnormality. LIVER: The liver is unremarkable. GALLBLADDER AND BILE DUCTS: Gallbladder is unremarkable. No biliary ductal dilatation. SPLEEN: No acute abnormality. PANCREAS: No acute abnormality. ADRENAL GLANDS: No acute abnormality. KIDNEYS, URETERS AND BLADDER: No stones in the kidneys or ureters. No hydronephrosis. No perinephric or periureteral stranding. Urinary bladder is unremarkable. GI AND BOWEL: Stomach demonstrates no acute abnormality. There is no bowel obstruction. Appendix cannot be definitively visualized PERITONEUM AND RETROPERITONEUM: Small amount of free fluid in the pelvis. No free air. VASCULATURE: Aorta is normal in caliber. LYMPH NODES: No lymphadenopathy. REPRODUCTIVE ORGANS: Inflammatory process in the right lower quadrant adjacent to the right side of the uterus, containing multiple cystic areas, the largest 2 cm. This process seems to be closer to the uterus than the cecum and ileocecal valve; therefore, an ovarian process, possibly a tubo-ovarian abscess, is favored. An IUD is present in the uterus. BONES AND SOFT TISSUES: No acute osseous abnormality. No focal soft tissue abnormality. IMPRESSION: 1. Inflammatory process in the right lower quadrant with multiple cystic areas up to 2 cm, favored ovarian in origin such as a tubo-ovarian abscess; ruptured appendicitis cannot be excluded due to nonvisualization of the appendix. 2. Small amount of free pelvic fluid. Electronically signed by: Franky Crease MD 08/11/2024 06:24 PM EDT RP Workstation: [id number]     .Critical Care  Performed by: Shermon Warren SAILOR, PA-C Authorized by: Shermon Warren SAILOR, PA-C   Critical care provider  statement:    Critical care time (minutes):  30   Critical care was necessary to treat or prevent imminent or life-threatening deterioration of the following conditions:  Sepsis   Critical care was time spent personally by me on the following activities:  Development of treatment plan with patient or surrogate, discussions with consultants, evaluation of patient's response to treatment, examination of patient, ordering and review of laboratory studies, ordering and review of radiographic studies, ordering and performing treatments and interventions, pulse oximetry, re-evaluation of patient's condition and review of old charts   Care discussed with: admitting provider      Medications Ordered in the ED  sodium chloride  0.9 % bolus 1,000 mL (1,000 mLs Intravenous New Bag/Given 08/11/24 1728)  ondansetron  (ZOFRAN ) injection 4 mg (4 mg Intravenous Given 08/11/24 1727)  morphine  (PF) 4 MG/ML injection 4 mg (4 mg Intravenous Given 08/11/24 1729)  iohexol (OMNIPAQUE) 350 MG/ML injection 75 mL (75 mLs  Intravenous Contrast Given 08/11/24 1808)    Clinical Course as of 08/11/24 1932  Sat Aug 11, 2024  1916 Clue Cells Wet Prep HPF POC(!): PRESENT [JB]    Clinical Course User Index [JB] Martesha Niedermeier, Warren SAILOR, PA-C                                 Medical Decision Making Amount and/or Complexity of Data Reviewed Labs: ordered. Decision-making details documented in ED Course. Radiology: ordered.  Risk Prescription drug management. Decision regarding hospitalization.   This patient presents to the ED for concern of abdominal pain, this involves an extensive number of treatment options, and is a complaint that carries with it a high risk of complications and morbidity.  The differential diagnosis includes cholecystitis, AAA, appendicitis, renal stone, UTI, diverticulitis, STD, tubo-ovarian abscess   Co morbidities that complicate the patient evaluation  History of C-section   Additional history  obtained:  Additional history obtained from patient was seen at urgent care today and sent here for further evaluation diagnosed with lower abdominal pain.   Lab Tests:  I personally interpreted labs.  The pertinent results include:   Patient has leukocytosis of 18.9 CMP unremarkable, lipase unremarkable, pregnancy test is negative UA with ketones, large leukocytes, white blood cells and rare bacteria Sent for urine culture Wet prep cells and WBCs present. Gonorrhea and chlamydia pending   Imaging Studies ordered:  I ordered imaging studies including CT scan of abdomen pelvis I independently visualized and interpreted imaging which showed inflammatory process of the right lower abdomen with multiple cystic areas with up to 2 cm abscess.  Likely tubo-ovarian abscess but differential would include ruptured appendicitis.  I will obtain pelvic ultrasound no acute findings I agree with the radiologist interpretation   Cardiac Monitoring: / EKG:  The patient was maintained on a cardiac monitor.     Consultations Obtained:  I requested consultation with the Dr Izell SHIPPER,  and discussed lab and imaging findings as well as pertinent plan - they recommend: Admit to hospital team, IV antibiotics which I have ordered.  They will see in consult and put in. I have requested consult with dental surgery Dr. Polly.  He will see patient in consult and recommending IV antibiotics and observation admission to the hospitalist team.  He states no plan for surgery at this time although he will come see in ED.   Problem List / ED Course / Critical interventions / Medication management  Patient presents to emergency room with complaint of lower abdominal pain for 2 days.  She has some nausea.  She denies any other symptoms.  She is afebrile and hemodynamically stable.  She does have white count and HA 97, will add blood cultures.  I did obtain a CT scan of her abdomen which shows multiple cystic  areas up to 2 cm in size favored to be tubo-ovarian abscess.  The differential would include ruptured appendicitis.  Does have presence of vaginal discharge and significant cervical motion tenderness on exam.  I am obtaining a pelvic ultrasound.  Will consult OB/GYN for recommendations as her antibiotic. I ordered medication including morphine , Zofran , normal saline Reevaluation of the patient after these medicines showed that the patient improved I have reviewed the patients home medicines and have made adjustments as needed. OB/GYN and general surgery to see.  IV antibiotics ordered.  Will consult hospital team for admission. Dr Alfornia to admit.  Final diagnoses:  Tubo-ovarian abscess    ED Discharge Orders     None          Shermon Warren SAILOR, PA-C 08/11/24 2050    Towana Ozell BROCKS, MD 08/12/24 [phone]

## 2024-08-11 NOTE — ED Notes (Signed)
 Patient is being discharged from the Urgent Care and sent to the Emergency Department via private vehicle . Per DR Rolinda, patient is in need of higher level of care due to abdominal pain. Patient is aware and verbalizes understanding of plan of care.

## 2024-08-11 NOTE — Procedures (Signed)
 Intrauterine Device (IUD) Removal Procedure Note  Patient with 34 year old Mirena and RLQ pain and ?2cm TOA. IUD removal recommended and patient amenable  ED RN chaperone present. EGBUS normal. +malodorous vaginal discharge, yellow. On bimanual, no CMT and I could feel strings but couldn't grip. Speculum exam done and yellowish, malodorous discharge seen. Cervix normal with IUD strings seen (approx 0.5cm in length). Strings grasped with kelly clamps and easily removed and noted to be intact.   No complications, patient tolerated the procedure well.  Bebe Izell Raddle MD Attending [facility] [employer] (Faculty Practice) 08/11/2024 Time: 2130

## 2024-08-12 DIAGNOSIS — R1031 Right lower quadrant pain: Secondary | ICD-10-CM

## 2024-08-12 DIAGNOSIS — N760 Acute vaginitis: Secondary | ICD-10-CM | POA: Diagnosis not present

## 2024-08-12 DIAGNOSIS — D649 Anemia, unspecified: Secondary | ICD-10-CM | POA: Diagnosis not present

## 2024-08-12 DIAGNOSIS — R1030 Lower abdominal pain, unspecified: Secondary | ICD-10-CM | POA: Diagnosis not present

## 2024-08-12 DIAGNOSIS — N739 Female pelvic inflammatory disease, unspecified: Secondary | ICD-10-CM | POA: Diagnosis not present

## 2024-08-12 LAB — BASIC METABOLIC PANEL WITH GFR
Anion gap: 13 (ref 5–15)
BUN: 8 mg/dL (ref 6–20)
CO2: 18 mmol/L — ABNORMAL LOW (ref 22–32)
Calcium: 8.2 mg/dL — ABNORMAL LOW (ref 8.9–10.3)
Chloride: 104 mmol/L (ref 98–111)
Creatinine, Ser: 0.67 mg/dL (ref 0.44–1.00)
GFR, Estimated: >60 mL/min (ref >60)
Glucose, Bld: 68 mg/dL — ABNORMAL LOW (ref 70–99)
Potassium: 3.5 mmol/L (ref 3.5–5.1)
Sodium: 135 mmol/L (ref 135–145)

## 2024-08-12 LAB — URINE CULTURE

## 2024-08-12 LAB — CBC
HCT: 29.1 % — ABNORMAL LOW (ref 36.0–46.0)
Hemoglobin: 9.7 g/dL — ABNORMAL LOW (ref 12.0–15.0)
MCH: 29.7 pg (ref 26.0–34.0)
MCHC: 33.3 g/dL (ref 30.0–36.0)
MCV: 89 fL (ref 80.0–100.0)
Platelets: 287 K/uL (ref 150–400)
RBC: 3.27 MIL/uL — ABNORMAL LOW (ref 3.87–5.11)
RDW: 12.1 % (ref 11.5–15.5)
WBC: 17.6 K/uL — ABNORMAL HIGH (ref 4.0–10.5)
nRBC: 0 % (ref 0.0–0.2)

## 2024-08-12 LAB — HIV ANTIBODY (ROUTINE TESTING W REFLEX): HIV Screen 4th Generation wRfx: NONREACTIVE

## 2024-08-12 LAB — RPR: RPR Ser Ql: NONREACTIVE

## 2024-08-12 NOTE — Progress Notes (Signed)
 Subjective: CC: Lower abdominal pain improved. Pain is diffuse across the lower abdominal but mostly LLQ. Passing flatus. BM yesterday. Tolerating cld without n/v. Reports her diet was just advanced to FLD. No hx of IBD. She has never had a colonoscopy. No hx of appendectomy.   Fever resolved. Tachycardia resolved. BP improved. WBC 17.6 (18.9).   Objective: Vital signs in last 24 hours: Temp:  [98.2 F (36.8 C)-100.6 F (38.1 C)] 98.2 F (36.8 C) (11/02 0912) Pulse Rate:  [86-100] 86 (11/02 0912) Resp:  [15-17] 17 (11/02 0912) BP: (96-115)/(57-73) 102/63 (11/02 0912) SpO2:  [98 %-100 %] 100 % (11/02 0912) Weight:  [49 kg] 49 kg (11/01 1610) Last BM Date : 08/11/24  Intake/Output from previous day: 11/01 0701 - 11/02 0700 In: 1536.1 [I.V.:89.9; IV Piggyback:1446.2] Out: -  Intake/Output this shift: No intake/output data recorded.  PE: Gen:  Alert, NAD, pleasant Abd: Soft, ND, diffusely ttp across the lower abdomen, she reports most in the LLQ without rigidity or guarding.   Lab Results:  Recent Labs    08/11/24 1629 08/12/24 0652  WBC 18.9* 17.6*  HGB 11.7* 9.7*  HCT 35.2* 29.1*  PLT 354 287   BMET Recent Labs    08/11/24 1629 08/12/24 0652  NA 134* 135  K 3.5 3.5  CL 97* 104  CO2 24 18*  GLUCOSE 89 68*  BUN 7 8  CREATININE 0.71 0.67  CALCIUM  9.1 8.2*   PT/INR No results for input(s): LABPROT, INR in the last 72 hours. CMP     Component Value Date/Time   NA 135 08/12/2024 0652   K 3.5 08/12/2024 0652   CL 104 08/12/2024 0652   CO2 18 (L) 08/12/2024 0652   GLUCOSE 68 (L) 08/12/2024 0652   BUN 8 08/12/2024 0652   CREATININE 0.67 08/12/2024 0652   CALCIUM  8.2 (L) 08/12/2024 0652   PROT 7.9 08/11/2024 1629   ALBUMIN 3.7 08/11/2024 1629   AST 21 08/11/2024 1629   ALT 26 08/11/2024 1629   ALKPHOS 75 08/11/2024 1629   BILITOT 1.0 08/11/2024 1629   GFRNONAA >60 08/12/2024 0652   GFRAA >60 03/06/2018 2020   Lipase     Component Value  Date/Time   LIPASE 26 08/11/2024 1629    Studies/Results: US  Pelvis Complete Addendum Date: 08/11/2024 ADDENDUM REPORT: 08/11/2024 20:43 ADDENDUM: Upon further evaluation an IUD is in place. This is confirmed on recent abdomen and pelvis CT (dated August 11, 2024). Electronically Signed   By: Suzen Dials M.D.   On: 08/11/2024 20:43   Result Date: 08/11/2024 CLINICAL DATA:  Pelvic pain. EXAM: TRANSABDOMINAL AND TRANSVAGINAL ULTRASOUND OF PELVIS DOPPLER ULTRASOUND OF OVARIES TECHNIQUE: Both transabdominal and transvaginal ultrasound examinations of the pelvis were performed. Transabdominal technique was performed for global imaging of the pelvis including uterus, ovaries, adnexal regions, and pelvic cul-de-sac. It was necessary to proceed with endovaginal exam following the transabdominal exam to visualize the uterus, endometrium, bilateral ovaries and bilateral adnexa. Color and duplex Doppler ultrasound was utilized to evaluate blood flow to the ovaries. COMPARISON:  None Available. FINDINGS: Uterus Measurements: 6.9 cm x 4.9 cm x 4.0 cm = volume: 79.0 mL. No fibroids or other mass visualized. Endometrium Thickness: 3.0 mm.  No focal abnormality visualized. Right ovary Measurements: 5.5 cm x 3.7 cm x 4.6 cm = volume: 47.7 mL. Normal appearance/no adnexal mass. Left ovary Measurements: 4.0 cm x 2.1 cm x 3.3 cm = volume: 14.9 mL. Normal appearance/no adnexal mass. Pulsed Doppler  evaluation of both ovaries demonstrates normal low-resistance arterial and venous waveforms. Other findings There is a small amount of pelvic free fluid, likely physiologic. The study is technically limited secondary to patient pain, as per the ultrasound technologist. IMPRESSION: Unremarkable pelvic ultrasound. Electronically Signed: By: Suzen Dials M.D. On: 08/11/2024 20:06   US  Transvaginal Non-OB Addendum Date: 08/11/2024 ADDENDUM REPORT: 08/11/2024 20:43 ADDENDUM: Upon further evaluation an IUD is in place. This is  confirmed on recent abdomen and pelvis CT (dated August 11, 2024). Electronically Signed   By: Suzen Dials M.D.   On: 08/11/2024 20:43   Result Date: 08/11/2024 CLINICAL DATA:  Pelvic pain. EXAM: TRANSABDOMINAL AND TRANSVAGINAL ULTRASOUND OF PELVIS DOPPLER ULTRASOUND OF OVARIES TECHNIQUE: Both transabdominal and transvaginal ultrasound examinations of the pelvis were performed. Transabdominal technique was performed for global imaging of the pelvis including uterus, ovaries, adnexal regions, and pelvic cul-de-sac. It was necessary to proceed with endovaginal exam following the transabdominal exam to visualize the uterus, endometrium, bilateral ovaries and bilateral adnexa. Color and duplex Doppler ultrasound was utilized to evaluate blood flow to the ovaries. COMPARISON:  None Available. FINDINGS: Uterus Measurements: 6.9 cm x 4.9 cm x 4.0 cm = volume: 79.0 mL. No fibroids or other mass visualized. Endometrium Thickness: 3.0 mm.  No focal abnormality visualized. Right ovary Measurements: 5.5 cm x 3.7 cm x 4.6 cm = volume: 47.7 mL. Normal appearance/no adnexal mass. Left ovary Measurements: 4.0 cm x 2.1 cm x 3.3 cm = volume: 14.9 mL. Normal appearance/no adnexal mass. Pulsed Doppler evaluation of both ovaries demonstrates normal low-resistance arterial and venous waveforms. Other findings There is a small amount of pelvic free fluid, likely physiologic. The study is technically limited secondary to patient pain, as per the ultrasound technologist. IMPRESSION: Unremarkable pelvic ultrasound. Electronically Signed: By: Suzen Dials M.D. On: 08/11/2024 20:06   US  Art/Ven Flow Abd Pelv Doppler Addendum Date: 08/11/2024 ADDENDUM REPORT: 08/11/2024 20:43 ADDENDUM: Upon further evaluation an IUD is in place. This is confirmed on recent abdomen and pelvis CT (dated August 11, 2024). Electronically Signed   By: Suzen Dials M.D.   On: 08/11/2024 20:43   Result Date: 08/11/2024 CLINICAL DATA:  Pelvic  pain. EXAM: TRANSABDOMINAL AND TRANSVAGINAL ULTRASOUND OF PELVIS DOPPLER ULTRASOUND OF OVARIES TECHNIQUE: Both transabdominal and transvaginal ultrasound examinations of the pelvis were performed. Transabdominal technique was performed for global imaging of the pelvis including uterus, ovaries, adnexal regions, and pelvic cul-de-sac. It was necessary to proceed with endovaginal exam following the transabdominal exam to visualize the uterus, endometrium, bilateral ovaries and bilateral adnexa. Color and duplex Doppler ultrasound was utilized to evaluate blood flow to the ovaries. COMPARISON:  None Available. FINDINGS: Uterus Measurements: 6.9 cm x 4.9 cm x 4.0 cm = volume: 79.0 mL. No fibroids or other mass visualized. Endometrium Thickness: 3.0 mm.  No focal abnormality visualized. Right ovary Measurements: 5.5 cm x 3.7 cm x 4.6 cm = volume: 47.7 mL. Normal appearance/no adnexal mass. Left ovary Measurements: 4.0 cm x 2.1 cm x 3.3 cm = volume: 14.9 mL. Normal appearance/no adnexal mass. Pulsed Doppler evaluation of both ovaries demonstrates normal low-resistance arterial and venous waveforms. Other findings There is a small amount of pelvic free fluid, likely physiologic. The study is technically limited secondary to patient pain, as per the ultrasound technologist. IMPRESSION: Unremarkable pelvic ultrasound. Electronically Signed: By: Suzen Dials M.D. On: 08/11/2024 20:06   CT ABDOMEN PELVIS W CONTRAST Result Date: 08/11/2024 EXAM: CT ABDOMEN AND PELVIS WITH CONTRAST 08/11/2024 06:07:58 PM TECHNIQUE:  CT of the abdomen and pelvis was performed with the administration of 75 mL of iohexol (OMNIPAQUE) 350 MG/ML injection. Multiplanar reformatted images are provided for review. Automated exposure control, iterative reconstruction, and/or weight-based adjustment of the mA/kV was utilized to reduce the radiation dose to as low as reasonably achievable. COMPARISON: None available. CLINICAL HISTORY: Abdominal pain,  acute, nonlocalized. FINDINGS: LOWER CHEST: No acute abnormality. LIVER: The liver is unremarkable. GALLBLADDER AND BILE DUCTS: Gallbladder is unremarkable. No biliary ductal dilatation. SPLEEN: No acute abnormality. PANCREAS: No acute abnormality. ADRENAL GLANDS: No acute abnormality. KIDNEYS, URETERS AND BLADDER: No stones in the kidneys or ureters. No hydronephrosis. No perinephric or periureteral stranding. Urinary bladder is unremarkable. GI AND BOWEL: Stomach demonstrates no acute abnormality. There is no bowel obstruction. Appendix cannot be definitively visualized PERITONEUM AND RETROPERITONEUM: Small amount of free fluid in the pelvis. No free air. VASCULATURE: Aorta is normal in caliber. LYMPH NODES: No lymphadenopathy. REPRODUCTIVE ORGANS: Inflammatory process in the right lower quadrant adjacent to the right side of the uterus, containing multiple cystic areas, the largest 2 cm. This process seems to be closer to the uterus than the cecum and ileocecal valve; therefore, an ovarian process, possibly a tubo-ovarian abscess, is favored. An IUD is present in the uterus. BONES AND SOFT TISSUES: No acute osseous abnormality. No focal soft tissue abnormality. IMPRESSION: 1. Inflammatory process in the right lower quadrant with multiple cystic areas up to 2 cm, favored ovarian in origin such as a tubo-ovarian abscess; ruptured appendicitis cannot be excluded due to nonvisualization of the appendix. 2. Small amount of free pelvic fluid. Electronically signed by: Franky Crease MD 08/11/2024 06:24 PM EDT RP Workstation: [id number]    Anti-infectives: Anti-infectives (From admission, onward)    Start     Dose/Rate Route Frequency Ordered Stop   08/11/24 2030  cefTRIAXone (ROCEPHIN) 1 g in sodium chloride  0.9 % 100 mL IVPB        1 g 200 mL/hr over 30 Minutes Intravenous Every 24 hours 08/11/24 2018     08/11/24 2030  metroNIDAZOLE (FLAGYL) IVPB 500 mg        500 mg 100 mL/hr over 60 Minutes Intravenous  Every 12 hours 08/11/24 2018     08/11/24 2030  doxycycline (VIBRAMYCIN) 100 mg in sodium chloride  0.9 % 250 mL IVPB        100 mg 125 mL/hr over 120 Minutes Intravenous Every 12 hours 08/11/24 2018          Assessment/Plan RLQ abdominal pain - Her exam, history, and lab work is most consistent with a gynecologic etiology for her symptoms and imaging, however, the appendix is not visualized and appendicitis cannot be ruled out. - No indication for surgical intervention at this time - IV abx - Appreciate OB eval and recs - Surgery will follow for now    FEN - On FLD, IVF per primary VTE - SCDs, okay for chem ppx from a general surgery standpoint ID - Rocephin/Flagyl/Doxy  I reviewed nursing notes, hospitalist notes, last 24 h vitals and pain scores, last 48 h intake and output, last 24 h labs and trends, and last 24 h imaging results.   LOS: 1 day    Ozell CHRISTELLA Shaper, [facility] 08/12/2024, 9:47 AM Please see Amion for pager number during day hours 7:00am-4:30pm

## 2024-08-12 NOTE — Plan of Care (Signed)
  Problem: Education: Goal: Knowledge of General Education information will improve Description: Including pain rating scale, medication(s)/side effects and non-pharmacologic comfort measures Outcome: Progressing   Problem: Clinical Measurements: Goal: Ability to maintain clinical measurements within normal limits will improve Outcome: Progressing Goal: Will remain free from infection Outcome: Progressing Goal: Diagnostic test results will improve Outcome: Progressing   Problem: Nutrition: Goal: Adequate nutrition will be maintained Outcome: Progressing   Problem: Coping: Goal: Level of anxiety will decrease Outcome: Progressing

## 2024-08-12 NOTE — Progress Notes (Signed)
 [facility] PRACTICE GYNECOLOGY COMPREHENSIVE PROGRESS NOTE  Olivia Farrell is a 33 y.o. G1P0101 admitted for persistent RLQ, chills & nausea, a/f possible PID/TOA  Length of Stay:  1 Days. Admitted 08/11/2024  Subjective: Feeling much better this morning. Reports pain has come down significantly. No nausea. Tolerating PO. No questions this AM.   Vitals:  Blood pressure 102/63, pulse 86, temperature 98.2 F (36.8 C), temperature source Oral, resp. rate 17, height 5' 1 (1.549 m), weight 49 kg, SpO2 100%. Physical Examination: CONSTITUTIONAL: Well-developed, well-nourished female in no acute distress.  CARDIOVASCULAR: Normal heart rate noted RESPIRATORY: Effort normal, no problems with respiration noted ABDOMEN: Soft, mildly tender in b/l lower quadrants without rebound tenderness or guarding.   ASSESSMENT: Principal Problem:   Lower abdominal pain Active Problems:   Bacterial vaginosis   Hyponatremia   Normocytic anemia  PLAN: PID/?TOA - clinically improving - IUD removed by Dr. Izell 11/1, unlikely to be source of infection  - GC/CT pending; HIV/RPR NR - continue IV CTX/doxy/flagyl until 24-48 hours afebrile, then transition to PO doxy/flagyl for 14 day course  Continue inpatient management. Will see patient 11/3 AM and will likely be candidate for discharge home. Hopeful we will have GYN follow up appointment scheduled for her this week.   Kieth Carolin, MD Obstetrician & Gynecologist, [facility] for [facility], [facility]  GYN Consult Phone: [phone] (M-F, [phone]) & [phone] (Off hours, weekends, holidays)

## 2024-08-12 NOTE — Progress Notes (Signed)
 PROGRESS NOTE    LILJA SOLAND  FMW:[id number] DOB: September 17, 1991 DOA: 08/11/2024 PCP: Sherrilee Graven, MD    Brief Narrative:  Olivia Farrell is a 33 y.o. female with medical history significant of anemia, preeclampsia presenting with a chief complaint of lower abdominal pain.  Patient is reporting 2-day history of severe pain across her lower abdomen.  She reports 1 episode of vomiting yesterday but no recurrence since then.  She reports having chills.  She has an IUD which was placed 11 years ago.  She is currently sexually active with one partner and states condoms are being used.  Denies noticing any vaginal discharge.  Denies dysuria or urinary frequency/urgency.  No other complaints.    Assessment and Plan: Lower abdominal pain Bacterial vaginosis ?ruptured appendicitis - WBC count was 18.9 and slowly trending down  - Pelvic exam done by ED PA positive for yellow colored vaginal discharge and cervical motion tenderness.  Wet prep showing clue cells and >10 WBCs.  GC chlamydia probe pending.  - CT abdomen pelvis with contrast showing inflammatory process in the right lower quadrant with multiple cystic areas up to 2 cm suspicious for tubo-ovarian abscess versus possible ruptured appendicitis.   - OB/GYN and general surgery consulted.  --Per Gyn upon his exam did not appreciate cervical motion tenderness.  -continuing IV antibiotics for now including ceftriaxone, doxycycline, and metronidazole. -Advance diet slowly   ?UTI UA with evidence of pyuria and bacteria but patient is not endorsing any urinary symptoms.  Already started on ceftriaxone -Urine culture never done   Mild hypovolemic hyponatremia -Resolved   Chronic normocytic anemia -Trend-no sign of bleeding   DVT prophylaxis: SCDs Start: 08/11/24 2128    Code Status: Full Code Family Communication:   Disposition Plan:  Level of care: Telemetry Status is: Inpatient     Consultants:   GYN Surgery    Subjective: Abdomen still tender but improved from yesterday  Objective: Vitals:   08/11/24 2001 08/11/24 2203 08/12/24 0521 08/12/24 0912  BP: 114/71 112/73 (!) 96/57 102/63  Pulse: 99 86 96 86  Resp: 16   17  Temp: 98.2 F (36.8 C) (!) 100.6 F (38.1 C) 98.5 F (36.9 C) 98.2 F (36.8 C)  TempSrc: Oral Oral Oral Oral  SpO2: 100% 100% 99% 100%  Weight:      Height:        Intake/Output Summary (Last 24 hours) at 08/12/2024 1153 Last data filed at 08/12/2024 9371 Gross per 24 hour  Intake 1536.09 ml  Output --  Net 1536.09 ml   Filed Weights   08/11/24 1610  Weight: 49 kg    Examination:   General: Appearance:    Well developed, well nourished female in no acute distress     Lungs:     respirations unlabored  Heart:    Normal heart rate.   MS:   All extremities are intact.    Neurologic:   Awake, alert       Data Reviewed: I have personally reviewed following labs and imaging studies  CBC: Recent Labs  Lab 08/11/24 1629 08/12/24 0652  WBC 18.9* 17.6*  HGB 11.7* 9.7*  HCT 35.2* 29.1*  MCV 88.2 89.0  PLT 354 287   Basic Metabolic Panel: Recent Labs  Lab 08/11/24 1629 08/12/24 0652  NA 134* 135  K 3.5 3.5  CL 97* 104  CO2 24 18*  GLUCOSE 89 68*  BUN 7 8  CREATININE 0.71 0.67  CALCIUM  9.1 8.2*  GFR: Estimated Creatinine Clearance: 74.8 mL/min (by C-G formula based on SCr of 0.67 mg/dL). Liver Function Tests: Recent Labs  Lab 08/11/24 1629  AST 21  ALT 26  ALKPHOS 75  BILITOT 1.0  PROT 7.9  ALBUMIN 3.7   Recent Labs  Lab 08/11/24 1629  LIPASE 26   No results for input(s): AMMONIA in the last 168 hours. Coagulation Profile: No results for input(s): INR, PROTIME in the last 168 hours. Cardiac Enzymes: No results for input(s): CKTOTAL, CKMB, CKMBINDEX, TROPONINI in the last 168 hours. BNP (last 3 results) No results for input(s): PROBNP in the last 8760 hours. HbA1C: No results for  input(s): HGBA1C in the last 72 hours. CBG: No results for input(s): GLUCAP in the last 168 hours. Lipid Profile: No results for input(s): CHOL, HDL, LDLCALC, TRIG, CHOLHDL, LDLDIRECT in the last 72 hours. Thyroid Function Tests: No results for input(s): TSH, T4TOTAL, FREET4, T3FREE, THYROIDAB in the last 72 hours. Anemia Panel: No results for input(s): VITAMINB12, FOLATE, FERRITIN, TIBC, IRON, RETICCTPCT in the last 72 hours. Sepsis Labs: No results for input(s): PROCALCITON, LATICACIDVEN in the last 168 hours.  Recent Results (from the past 240 hours)  Wet prep, genital     Status: Abnormal   Collection Time: 08/11/24  6:55 PM  Result Value Ref Range Status   Yeast Wet Prep HPF POC NONE SEEN NONE SEEN Final   Trich, Wet Prep NONE SEEN NONE SEEN Final   Clue Cells Wet Prep HPF POC PRESENT (A) NONE SEEN Final   WBC, Wet Prep HPF POC >=10 (A) <10 Final   Sperm NONE SEEN  Final    Comment: Performed at [facility], [address], [place], [place] [address]  Blood culture (routine x 2)     Status: None (Preliminary result)   Collection Time: 08/11/24  9:51 PM   Specimen: BLOOD RIGHT ARM  Result Value Ref Range Status   Specimen Description BLOOD RIGHT ARM  Final   Special Requests   Final    BOTTLES DRAWN AEROBIC ONLY Blood Culture adequate volume   Culture   Final    NO GROWTH < 12 HOURS Performed at [facility], [address], [place], [place] [address]    Report Status PENDING  Incomplete  Blood culture (routine x 2)     Status: None (Preliminary result)   Collection Time: 08/11/24  9:52 PM   Specimen: BLOOD  Result Value Ref Range Status   Specimen Description BLOOD BLOOD LEFT ARM  Final   Special Requests   Final    BOTTLES DRAWN AEROBIC AND ANAEROBIC Blood Culture adequate volume   Culture   Final    NO GROWTH < 12 HOURS Performed at [facility], [address] [place], [place] [address]    Report Status  PENDING  Incomplete         Radiology Studies: US  Pelvis Complete Addendum Date: 08/11/2024 ADDENDUM REPORT: 08/11/2024 20:43 ADDENDUM: Upon further evaluation an IUD is in place. This is confirmed on recent abdomen and pelvis CT (dated August 11, 2024). Electronically Signed   By: Suzen Dials M.D.   On: 08/11/2024 20:43   Result Date: 08/11/2024 CLINICAL DATA:  Pelvic pain. EXAM: TRANSABDOMINAL AND TRANSVAGINAL ULTRASOUND OF PELVIS DOPPLER ULTRASOUND OF OVARIES TECHNIQUE: Both transabdominal and transvaginal ultrasound examinations of the pelvis were performed. Transabdominal technique was performed for global imaging of the pelvis including uterus, ovaries, adnexal regions, and pelvic cul-de-sac. It was necessary to  proceed with endovaginal exam following the transabdominal exam to visualize the uterus, endometrium, bilateral ovaries and bilateral adnexa. Color and duplex Doppler ultrasound was utilized to evaluate blood flow to the ovaries. COMPARISON:  None Available. FINDINGS: Uterus Measurements: 6.9 cm x 4.9 cm x 4.0 cm = volume: 79.0 mL. No fibroids or other mass visualized. Endometrium Thickness: 3.0 mm.  No focal abnormality visualized. Right ovary Measurements: 5.5 cm x 3.7 cm x 4.6 cm = volume: 47.7 mL. Normal appearance/no adnexal mass. Left ovary Measurements: 4.0 cm x 2.1 cm x 3.3 cm = volume: 14.9 mL. Normal appearance/no adnexal mass. Pulsed Doppler evaluation of both ovaries demonstrates normal low-resistance arterial and venous waveforms. Other findings There is a small amount of pelvic free fluid, likely physiologic. The study is technically limited secondary to patient pain, as per the ultrasound technologist. IMPRESSION: Unremarkable pelvic ultrasound. Electronically Signed: By: Suzen Dials M.D. On: 08/11/2024 20:06   US  Transvaginal Non-OB Addendum Date: 08/11/2024 ADDENDUM REPORT: 08/11/2024 20:43 ADDENDUM: Upon further evaluation an IUD is in place. This is  confirmed on recent abdomen and pelvis CT (dated August 11, 2024). Electronically Signed   By: Suzen Dials M.D.   On: 08/11/2024 20:43   Result Date: 08/11/2024 CLINICAL DATA:  Pelvic pain. EXAM: TRANSABDOMINAL AND TRANSVAGINAL ULTRASOUND OF PELVIS DOPPLER ULTRASOUND OF OVARIES TECHNIQUE: Both transabdominal and transvaginal ultrasound examinations of the pelvis were performed. Transabdominal technique was performed for global imaging of the pelvis including uterus, ovaries, adnexal regions, and pelvic cul-de-sac. It was necessary to proceed with endovaginal exam following the transabdominal exam to visualize the uterus, endometrium, bilateral ovaries and bilateral adnexa. Color and duplex Doppler ultrasound was utilized to evaluate blood flow to the ovaries. COMPARISON:  None Available. FINDINGS: Uterus Measurements: 6.9 cm x 4.9 cm x 4.0 cm = volume: 79.0 mL. No fibroids or other mass visualized. Endometrium Thickness: 3.0 mm.  No focal abnormality visualized. Right ovary Measurements: 5.5 cm x 3.7 cm x 4.6 cm = volume: 47.7 mL. Normal appearance/no adnexal mass. Left ovary Measurements: 4.0 cm x 2.1 cm x 3.3 cm = volume: 14.9 mL. Normal appearance/no adnexal mass. Pulsed Doppler evaluation of both ovaries demonstrates normal low-resistance arterial and venous waveforms. Other findings There is a small amount of pelvic free fluid, likely physiologic. The study is technically limited secondary to patient pain, as per the ultrasound technologist. IMPRESSION: Unremarkable pelvic ultrasound. Electronically Signed: By: Suzen Dials M.D. On: 08/11/2024 20:06   US  Art/Ven Flow Abd Pelv Doppler Addendum Date: 08/11/2024 ADDENDUM REPORT: 08/11/2024 20:43 ADDENDUM: Upon further evaluation an IUD is in place. This is confirmed on recent abdomen and pelvis CT (dated August 11, 2024). Electronically Signed   By: Suzen Dials M.D.   On: 08/11/2024 20:43   Result Date: 08/11/2024 CLINICAL DATA:  Pelvic  pain. EXAM: TRANSABDOMINAL AND TRANSVAGINAL ULTRASOUND OF PELVIS DOPPLER ULTRASOUND OF OVARIES TECHNIQUE: Both transabdominal and transvaginal ultrasound examinations of the pelvis were performed. Transabdominal technique was performed for global imaging of the pelvis including uterus, ovaries, adnexal regions, and pelvic cul-de-sac. It was necessary to proceed with endovaginal exam following the transabdominal exam to visualize the uterus, endometrium, bilateral ovaries and bilateral adnexa. Color and duplex Doppler ultrasound was utilized to evaluate blood flow to the ovaries. COMPARISON:  None Available. FINDINGS: Uterus Measurements: 6.9 cm x 4.9 cm x 4.0 cm = volume: 79.0 mL. No fibroids or other mass visualized. Endometrium Thickness: 3.0 mm.  No focal abnormality visualized. Right ovary Measurements: 5.5 cm x 3.7  cm x 4.6 cm = volume: 47.7 mL. Normal appearance/no adnexal mass. Left ovary Measurements: 4.0 cm x 2.1 cm x 3.3 cm = volume: 14.9 mL. Normal appearance/no adnexal mass. Pulsed Doppler evaluation of both ovaries demonstrates normal low-resistance arterial and venous waveforms. Other findings There is a small amount of pelvic free fluid, likely physiologic. The study is technically limited secondary to patient pain, as per the ultrasound technologist. IMPRESSION: Unremarkable pelvic ultrasound. Electronically Signed: By: Suzen Dials M.D. On: 08/11/2024 20:06   CT ABDOMEN PELVIS W CONTRAST Result Date: 08/11/2024 EXAM: CT ABDOMEN AND PELVIS WITH CONTRAST 08/11/2024 06:07:58 PM TECHNIQUE: CT of the abdomen and pelvis was performed with the administration of 75 mL of iohexol (OMNIPAQUE) 350 MG/ML injection. Multiplanar reformatted images are provided for review. Automated exposure control, iterative reconstruction, and/or weight-based adjustment of the mA/kV was utilized to reduce the radiation dose to as low as reasonably achievable. COMPARISON: None available. CLINICAL HISTORY: Abdominal pain,  acute, nonlocalized. FINDINGS: LOWER CHEST: No acute abnormality. LIVER: The liver is unremarkable. GALLBLADDER AND BILE DUCTS: Gallbladder is unremarkable. No biliary ductal dilatation. SPLEEN: No acute abnormality. PANCREAS: No acute abnormality. ADRENAL GLANDS: No acute abnormality. KIDNEYS, URETERS AND BLADDER: No stones in the kidneys or ureters. No hydronephrosis. No perinephric or periureteral stranding. Urinary bladder is unremarkable. GI AND BOWEL: Stomach demonstrates no acute abnormality. There is no bowel obstruction. Appendix cannot be definitively visualized PERITONEUM AND RETROPERITONEUM: Small amount of free fluid in the pelvis. No free air. VASCULATURE: Aorta is normal in caliber. LYMPH NODES: No lymphadenopathy. REPRODUCTIVE ORGANS: Inflammatory process in the right lower quadrant adjacent to the right side of the uterus, containing multiple cystic areas, the largest 2 cm. This process seems to be closer to the uterus than the cecum and ileocecal valve; therefore, an ovarian process, possibly a tubo-ovarian abscess, is favored. An IUD is present in the uterus. BONES AND SOFT TISSUES: No acute osseous abnormality. No focal soft tissue abnormality. IMPRESSION: 1. Inflammatory process in the right lower quadrant with multiple cystic areas up to 2 cm, favored ovarian in origin such as a tubo-ovarian abscess; ruptured appendicitis cannot be excluded due to nonvisualization of the appendix. 2. Small amount of free pelvic fluid. Electronically signed by: Franky Crease MD 08/11/2024 06:24 PM EDT RP Workstation: [id number]        Scheduled Meds: Continuous Infusions:  cefTRIAXone (ROCEPHIN)  IV Stopped (08/11/24 2110)   doxycycline (VIBRAMYCIN) IV 100 mg (08/12/24 1038)   metronidazole 500 mg (08/12/24 0907)     LOS: 1 day    Time spent: 45 minutes spent on chart review, discussion with nursing staff, consultants, updating family and interview/physical exam; more than 50% of that time was  spent in counseling and/or coordination of care.    Harlene RAYMOND Bowl, DO [facility] Available via Epic secure chat 7am-7pm After these hours, please refer to coverage provider listed on [email or web address] 08/12/2024, 11:53 AM

## 2024-08-13 ENCOUNTER — Ambulatory Visit (HOSPITAL_COMMUNITY): Payer: Self-pay

## 2024-08-13 DIAGNOSIS — R1031 Right lower quadrant pain: Secondary | ICD-10-CM | POA: Diagnosis not present

## 2024-08-13 DIAGNOSIS — D649 Anemia, unspecified: Secondary | ICD-10-CM | POA: Diagnosis not present

## 2024-08-13 DIAGNOSIS — N739 Female pelvic inflammatory disease, unspecified: Secondary | ICD-10-CM | POA: Diagnosis not present

## 2024-08-13 DIAGNOSIS — N760 Acute vaginitis: Secondary | ICD-10-CM | POA: Diagnosis not present

## 2024-08-13 DIAGNOSIS — N730 Acute parametritis and pelvic cellulitis: Secondary | ICD-10-CM

## 2024-08-13 DIAGNOSIS — R1030 Lower abdominal pain, unspecified: Secondary | ICD-10-CM | POA: Diagnosis not present

## 2024-08-13 LAB — BASIC METABOLIC PANEL WITH GFR
Anion gap: 14 (ref 5–15)
BUN: 5 mg/dL — ABNORMAL LOW (ref 6–20)
CO2: 20 mmol/L — ABNORMAL LOW (ref 22–32)
Calcium: 8.6 mg/dL — ABNORMAL LOW (ref 8.9–10.3)
Chloride: 101 mmol/L (ref 98–111)
Creatinine, Ser: 0.59 mg/dL (ref 0.44–1.00)
GFR, Estimated: >60 mL/min (ref >60)
Glucose, Bld: 70 mg/dL (ref 70–99)
Potassium: 3.4 mmol/L — ABNORMAL LOW (ref 3.5–5.1)
Sodium: 135 mmol/L (ref 135–145)

## 2024-08-13 LAB — CBC
HCT: 30.5 % — ABNORMAL LOW (ref 36.0–46.0)
Hemoglobin: 10.4 g/dL — ABNORMAL LOW (ref 12.0–15.0)
MCH: 29.6 pg (ref 26.0–34.0)
MCHC: 34.1 g/dL (ref 30.0–36.0)
MCV: 86.9 fL (ref 80.0–100.0)
Platelets: 364 K/uL (ref 150–400)
RBC: 3.51 MIL/uL — ABNORMAL LOW (ref 3.87–5.11)
RDW: 12 % (ref 11.5–15.5)
WBC: 16.2 K/uL — ABNORMAL HIGH (ref 4.0–10.5)
nRBC: 0 % (ref 0.0–0.2)

## 2024-08-13 LAB — GC/CHLAMYDIA PROBE AMP (~~LOC~~) NOT AT ARMC
Chlamydia: NEGATIVE
Comment: NEGATIVE
Comment: NORMAL
Neisseria Gonorrhea: NEGATIVE

## 2024-08-13 MED ORDER — POTASSIUM CHLORIDE CRYS ER 20 MEQ PO TBCR
40.0000 meq | EXTENDED_RELEASE_TABLET | Freq: Once | ORAL | Status: AC
  Administered 2024-08-13: 40 meq via ORAL
  Filled 2024-08-13: qty 2

## 2024-08-13 MED ORDER — KCL IN DEXTROSE-NACL 20-5-0.9 MEQ/L-%-% IV SOLN
INTRAVENOUS | Status: DC
  Filled 2024-08-13: qty 1000

## 2024-08-13 MED ORDER — MELATONIN 3 MG PO TABS
3.0000 mg | ORAL_TABLET | Freq: Every day | ORAL | Status: DC
  Administered 2024-08-13 – 2024-08-14 (×2): 3 mg via ORAL
  Filled 2024-08-13 (×2): qty 1

## 2024-08-13 NOTE — TOC Initial Note (Signed)
 Transition of Care [facility]) - Initial/Assessment Note    Patient Details  Name: Olivia Farrell MRN: [id number] Date of Birth: 09-16-1991  [facility] CM/SW Contact:    Jeoffrey LITTIE Maranda ISRAEL Phone Number: 08/13/2024, 8:52 AM  Clinical Narrative:                 Pt admitted from home due to abdominal pain. No current TOC needs, please consult as needs arise.        Patient Goals and CMS Choice            Expected Discharge Plan and Services                                              Prior Living Arrangements/Services                       Activities of Daily Living      Permission Sought/Granted                  Emotional Assessment              Admission diagnosis:  Tubo-ovarian abscess [N70.93] Lower abdominal pain [R10.30] Patient Active Problem List   Diagnosis Date Noted   Lower abdominal pain 08/11/2024   Bacterial vaginosis 08/11/2024   Hyponatremia 08/11/2024   Normocytic anemia 08/11/2024   SGA (small for gestational age) 08/16/2012   Preeclampsia 08/16/2012   PCP:  Sherrilee Graven, MD Pharmacy:   [facility] - [address] ([place]), [place] - [address] [place] ([place]) [place] [address] Phone: [phone] Fax: [phone]  [facility] [address] [place]), [place] - [address] [place] [address] Phone: [phone] Fax: [phone]     Social Drivers of Health (SDOH) Social History: SDOH Screenings   Food Insecurity: No Food Insecurity (08/11/2024)  Housing: Low Risk  (08/11/2024)  Transportation Needs: No Transportation Needs (08/11/2024)  Utilities: Not At Risk (08/11/2024)  Tobacco Use: Low Risk  (08/11/2024)   SDOH Interventions:     Readmission Risk Interventions     No data to display

## 2024-08-13 NOTE — Plan of Care (Signed)
  Problem: Clinical Measurements: Goal: Will remain free from infection Outcome: Progressing   Problem: Pain Managment: Goal: General experience of comfort will improve and/or be controlled Outcome: Progressing

## 2024-08-13 NOTE — Progress Notes (Signed)
 Subjective: Patient reports feeling better No emesis.    Objective: I have reviewed patient's vital signs, intake and output, medications, labs, microbiology, and radiology results.  General: alert, cooperative, and no distress GI: soft, non-tender; bowel sounds normal; no masses,  no organomegaly Benign exam  Sonogram rules out TOA, this is PID   Assessment/Plan: PID: contineu IV Abx for total 72h then discharge on oral doxycyline + flagyl to complete 14 day course Will see daily and do interval assessment  LOS: 2 days    Olivia VEAR Inch, MD 08/13/2024, 4:34 PM

## 2024-08-13 NOTE — Progress Notes (Signed)
 Subjective: CC: Asked to see back by TRH given ongoing fevers and leukocytosis.   Tmax 100. Tachycardic to 103. Soft BP improved with last pressure 104/69. +1.8L since admission. WBC 16.2 from 17.6.   Patient reports ongoing lower abdominal pain. She is not sure if one area is worse than the rest. She had worsening pain when she ate jello yesterday. Able to take water without worsening pain. No n/v. Passing flatus. Last BM 11/1. No hx of similar pain in the past.   Objective: Vital signs in last 24 hours: Temp:  [98.2 F (36.8 C)-100 F (37.8 C)] 99 F (37.2 C) (11/03 0555) Pulse Rate:  [82-103] 103 (11/03 0555) Resp:  [17] 17 (11/03 0555) BP: (96-104)/(57-69) 104/69 (11/03 0555) SpO2:  [100 %] 100 % (11/03 0555) Last BM Date : 08/11/24  Intake/Output from previous day: 11/02 0701 - 11/03 0700 In: 350 [IV Piggyback:350] Out: -  Intake/Output this shift: No intake/output data recorded.  PE: Gen:  Alert, NAD, pleasant Abd: Soft, ND, diffusely ttp across the lower abdomen, she reports most in the LLQ without rigidity or guarding.   Lab Results:  Recent Labs    08/12/24 0652 08/13/24 0422  WBC 17.6* 16.2*  HGB 9.7* 10.4*  HCT 29.1* 30.5*  PLT 287 364   BMET Recent Labs    08/12/24 0652 08/13/24 0422  NA 135 135  K 3.5 3.4*  CL 104 101  CO2 18* 20*  GLUCOSE 68* 70  BUN 8 5*  CREATININE 0.67 0.59  CALCIUM  8.2* 8.6*   PT/INR No results for input(s): LABPROT, INR in the last 72 hours. CMP     Component Value Date/Time   NA 135 08/13/2024 0422   K 3.4 (L) 08/13/2024 0422   CL 101 08/13/2024 0422   CO2 20 (L) 08/13/2024 0422   GLUCOSE 70 08/13/2024 0422   BUN 5 (L) 08/13/2024 0422   CREATININE 0.59 08/13/2024 0422   CALCIUM  8.6 (L) 08/13/2024 0422   PROT 7.9 08/11/2024 1629   ALBUMIN 3.7 08/11/2024 1629   AST 21 08/11/2024 1629   ALT 26 08/11/2024 1629   ALKPHOS 75 08/11/2024 1629   BILITOT 1.0 08/11/2024 1629   GFRNONAA >60 08/13/2024  0422   GFRAA >60 03/06/2018 2020   Lipase     Component Value Date/Time   LIPASE 26 08/11/2024 1629    Studies/Results: US  Pelvis Complete Addendum Date: 08/11/2024 ADDENDUM REPORT: 08/11/2024 20:43 ADDENDUM: Upon further evaluation an IUD is in place. This is confirmed on recent abdomen and pelvis CT (dated August 11, 2024). Electronically Signed   By: Suzen Dials M.D.   On: 08/11/2024 20:43   Result Date: 08/11/2024 CLINICAL DATA:  Pelvic pain. EXAM: TRANSABDOMINAL AND TRANSVAGINAL ULTRASOUND OF PELVIS DOPPLER ULTRASOUND OF OVARIES TECHNIQUE: Both transabdominal and transvaginal ultrasound examinations of the pelvis were performed. Transabdominal technique was performed for global imaging of the pelvis including uterus, ovaries, adnexal regions, and pelvic cul-de-sac. It was necessary to proceed with endovaginal exam following the transabdominal exam to visualize the uterus, endometrium, bilateral ovaries and bilateral adnexa. Color and duplex Doppler ultrasound was utilized to evaluate blood flow to the ovaries. COMPARISON:  None Available. FINDINGS: Uterus Measurements: 6.9 cm x 4.9 cm x 4.0 cm = volume: 79.0 mL. No fibroids or other mass visualized. Endometrium Thickness: 3.0 mm.  No focal abnormality visualized. Right ovary Measurements: 5.5 cm x 3.7 cm x 4.6 cm = volume: 47.7 mL. Normal appearance/no adnexal mass. Left ovary  Measurements: 4.0 cm x 2.1 cm x 3.3 cm = volume: 14.9 mL. Normal appearance/no adnexal mass. Pulsed Doppler evaluation of both ovaries demonstrates normal low-resistance arterial and venous waveforms. Other findings There is a small amount of pelvic free fluid, likely physiologic. The study is technically limited secondary to patient pain, as per the ultrasound technologist. IMPRESSION: Unremarkable pelvic ultrasound. Electronically Signed: By: Suzen Dials M.D. On: 08/11/2024 20:06   US  Transvaginal Non-OB Addendum Date: 08/11/2024 ADDENDUM REPORT: 08/11/2024  20:43 ADDENDUM: Upon further evaluation an IUD is in place. This is confirmed on recent abdomen and pelvis CT (dated August 11, 2024). Electronically Signed   By: Suzen Dials M.D.   On: 08/11/2024 20:43   Result Date: 08/11/2024 CLINICAL DATA:  Pelvic pain. EXAM: TRANSABDOMINAL AND TRANSVAGINAL ULTRASOUND OF PELVIS DOPPLER ULTRASOUND OF OVARIES TECHNIQUE: Both transabdominal and transvaginal ultrasound examinations of the pelvis were performed. Transabdominal technique was performed for global imaging of the pelvis including uterus, ovaries, adnexal regions, and pelvic cul-de-sac. It was necessary to proceed with endovaginal exam following the transabdominal exam to visualize the uterus, endometrium, bilateral ovaries and bilateral adnexa. Color and duplex Doppler ultrasound was utilized to evaluate blood flow to the ovaries. COMPARISON:  None Available. FINDINGS: Uterus Measurements: 6.9 cm x 4.9 cm x 4.0 cm = volume: 79.0 mL. No fibroids or other mass visualized. Endometrium Thickness: 3.0 mm.  No focal abnormality visualized. Right ovary Measurements: 5.5 cm x 3.7 cm x 4.6 cm = volume: 47.7 mL. Normal appearance/no adnexal mass. Left ovary Measurements: 4.0 cm x 2.1 cm x 3.3 cm = volume: 14.9 mL. Normal appearance/no adnexal mass. Pulsed Doppler evaluation of both ovaries demonstrates normal low-resistance arterial and venous waveforms. Other findings There is a small amount of pelvic free fluid, likely physiologic. The study is technically limited secondary to patient pain, as per the ultrasound technologist. IMPRESSION: Unremarkable pelvic ultrasound. Electronically Signed: By: Suzen Dials M.D. On: 08/11/2024 20:06   US  Art/Ven Flow Abd Pelv Doppler Addendum Date: 08/11/2024 ADDENDUM REPORT: 08/11/2024 20:43 ADDENDUM: Upon further evaluation an IUD is in place. This is confirmed on recent abdomen and pelvis CT (dated August 11, 2024). Electronically Signed   By: Suzen Dials M.D.   On:  08/11/2024 20:43   Result Date: 08/11/2024 CLINICAL DATA:  Pelvic pain. EXAM: TRANSABDOMINAL AND TRANSVAGINAL ULTRASOUND OF PELVIS DOPPLER ULTRASOUND OF OVARIES TECHNIQUE: Both transabdominal and transvaginal ultrasound examinations of the pelvis were performed. Transabdominal technique was performed for global imaging of the pelvis including uterus, ovaries, adnexal regions, and pelvic cul-de-sac. It was necessary to proceed with endovaginal exam following the transabdominal exam to visualize the uterus, endometrium, bilateral ovaries and bilateral adnexa. Color and duplex Doppler ultrasound was utilized to evaluate blood flow to the ovaries. COMPARISON:  None Available. FINDINGS: Uterus Measurements: 6.9 cm x 4.9 cm x 4.0 cm = volume: 79.0 mL. No fibroids or other mass visualized. Endometrium Thickness: 3.0 mm.  No focal abnormality visualized. Right ovary Measurements: 5.5 cm x 3.7 cm x 4.6 cm = volume: 47.7 mL. Normal appearance/no adnexal mass. Left ovary Measurements: 4.0 cm x 2.1 cm x 3.3 cm = volume: 14.9 mL. Normal appearance/no adnexal mass. Pulsed Doppler evaluation of both ovaries demonstrates normal low-resistance arterial and venous waveforms. Other findings There is a small amount of pelvic free fluid, likely physiologic. The study is technically limited secondary to patient pain, as per the ultrasound technologist. IMPRESSION: Unremarkable pelvic ultrasound. Electronically Signed: By: Suzen Dials M.D. On: 08/11/2024 20:06  CT ABDOMEN PELVIS W CONTRAST Result Date: 08/11/2024 EXAM: CT ABDOMEN AND PELVIS WITH CONTRAST 08/11/2024 06:07:58 PM TECHNIQUE: CT of the abdomen and pelvis was performed with the administration of 75 mL of iohexol (OMNIPAQUE) 350 MG/ML injection. Multiplanar reformatted images are provided for review. Automated exposure control, iterative reconstruction, and/or weight-based adjustment of the mA/kV was utilized to reduce the radiation dose to as low as reasonably  achievable. COMPARISON: None available. CLINICAL HISTORY: Abdominal pain, acute, nonlocalized. FINDINGS: LOWER CHEST: No acute abnormality. LIVER: The liver is unremarkable. GALLBLADDER AND BILE DUCTS: Gallbladder is unremarkable. No biliary ductal dilatation. SPLEEN: No acute abnormality. PANCREAS: No acute abnormality. ADRENAL GLANDS: No acute abnormality. KIDNEYS, URETERS AND BLADDER: No stones in the kidneys or ureters. No hydronephrosis. No perinephric or periureteral stranding. Urinary bladder is unremarkable. GI AND BOWEL: Stomach demonstrates no acute abnormality. There is no bowel obstruction. Appendix cannot be definitively visualized PERITONEUM AND RETROPERITONEUM: Small amount of free fluid in the pelvis. No free air. VASCULATURE: Aorta is normal in caliber. LYMPH NODES: No lymphadenopathy. REPRODUCTIVE ORGANS: Inflammatory process in the right lower quadrant adjacent to the right side of the uterus, containing multiple cystic areas, the largest 2 cm. This process seems to be closer to the uterus than the cecum and ileocecal valve; therefore, an ovarian process, possibly a tubo-ovarian abscess, is favored. An IUD is present in the uterus. BONES AND SOFT TISSUES: No acute osseous abnormality. No focal soft tissue abnormality. IMPRESSION: 1. Inflammatory process in the right lower quadrant with multiple cystic areas up to 2 cm, favored ovarian in origin such as a tubo-ovarian abscess; ruptured appendicitis cannot be excluded due to nonvisualization of the appendix. 2. Small amount of free pelvic fluid. Electronically signed by: Franky Crease MD 08/11/2024 06:24 PM EDT RP Workstation: [id number]    Anti-infectives: Anti-infectives (From admission, onward)    Start     Dose/Rate Route Frequency Ordered Stop   08/11/24 2030  cefTRIAXone (ROCEPHIN) 1 g in sodium chloride  0.9 % 100 mL IVPB        1 g 200 mL/hr over 30 Minutes Intravenous Every 24 hours 08/11/24 2018     08/11/24 2030  metroNIDAZOLE  (FLAGYL) IVPB 500 mg        500 mg 100 mL/hr over 60 Minutes Intravenous Every 12 hours 08/11/24 2018     08/11/24 2030  doxycycline (VIBRAMYCIN) 100 mg in sodium chloride  0.9 % 250 mL IVPB        100 mg 125 mL/hr over 120 Minutes Intravenous Every 12 hours 08/11/24 2018          Assessment/Plan RLQ abdominal pain - Her exam, history, and lab work is most consistent with a gynecologic etiology for her symptoms and imaging, however, the appendix is not visualized and appendicitis cannot be ruled out.  - No indication for surgical intervention at this time from a general surgery standpoint - Appreciate OB eval and recs. They are covering for possible PID/?TOA with IV CTX/doxy/flagyl until 24-48 hours afebrile, then transition to PO doxy/flagyl for 14 day course. Will follow up on there recommendations from today.     FEN - Wouldn't advance past liquids today. IVF per primary VTE - SCDs, okay for chem ppx from a general surgery standpoint ID - Rocephin/Flagyl/Doxy  I reviewed nursing notes, hospitalist notes, last 24 h vitals and pain scores, last 48 h intake and output, last 24 h labs and trends, and last 24 h imaging results.   LOS: 2 days  Ozell CHRISTELLA Shaper, [facility] Surgery 08/13/2024, 8:15 AM Please see Amion for pager number during day hours 7:00am-4:30pm

## 2024-08-13 NOTE — Progress Notes (Addendum)
 PROGRESS NOTE    Olivia Farrell  FMW:[id number] DOB: May 12, 1991 DOA: 08/11/2024 PCP: Sherrilee Graven, MD    Brief Narrative:  Olivia Farrell is a 33 y.o. female with medical history significant of anemia, preeclampsia presenting with a chief complaint of lower abdominal pain.  Patient is reporting 2-day history of severe pain across her lower abdomen.  She reports 1 episode of vomiting yesterday but no recurrence since then.  She reports having chills.  She has an IUD which was placed 11 years ago.  She is currently sexually active with one partner and states condoms are being used.  Denies noticing any vaginal discharge.  Denies dysuria or urinary frequency/urgency.  No other complaints.  Continues to have an elevated white blood cell count, not tolerating oral except for sips of water, continues with abdominal pain and fever.   Assessment and Plan: Lower abdominal pain Bacterial vaginosis/PID ?ruptured appendicitis - WBC count was 18.9 and slowly trending down  - Pelvic exam done by ED PA positive for yellow colored vaginal discharge and cervical motion tenderness.  Wet prep showing clue cells and >10 WBCs.  GC chlamydia probe pending.  - CT abdomen pelvis with contrast showing inflammatory process in the right lower quadrant with multiple cystic areas up to 2 cm suspicious for tubo-ovarian abscess versus possible ruptured appendicitis.   - OB/GYN and general surgery consulted.  --Per Gyn upon his exam did not appreciate cervical motion tenderness.  -continuing IV antibiotics for now including ceftriaxone, doxycycline, and metronidazole. -Not tolerating diet   ?UTI UA with evidence of pyuria and bacteria but patient is not endorsing any urinary symptoms.  Already started on ceftriaxone -Urine culture with multiple species   Mild hypovolemic hyponatremia -Resolved   Hyperglycemia - IV fluids  Chronic normocytic anemia -Trend-no sign of bleeding  Hypokalemia -  Replete  DVT prophylaxis: SCDs Start: 08/11/24 2128    Code Status: Full Code   Disposition Plan:  Level of care: Med-Surg Status is: Inpatient     Consultants:  GYN Surgery    Subjective: Not tolerating orals except for sips of water  Objective: Vitals:   08/12/24 1708 08/12/24 2300 08/13/24 0555 08/13/24 0932  BP: 101/68 (!) 96/57 104/69 109/75  Pulse: 97 82 (!) 103 100  Resp: 17 17 17 17   Temp: 100 F (37.8 C) 98.7 F (37.1 C) 99 F (37.2 C) 99 F (37.2 C)  TempSrc: Oral Oral Oral Oral  SpO2: 100% 100% 100% 100%  Weight:      Height:        Intake/Output Summary (Last 24 hours) at 08/13/2024 1136 Last data filed at 08/12/2024 1500 Gross per 24 hour  Intake 350 ml  Output --  Net 350 ml   Filed Weights   08/11/24 1610  Weight: 49 kg    Examination:   General: Appearance:    Well developed, well nourished female in no acute distress     Lungs:     respirations unlabored  Heart:    Tachycardic.   MS:   All extremities are intact.    Neurologic:   Awake, alert       Data Reviewed: I have personally reviewed following labs and imaging studies  CBC: Recent Labs  Lab 08/11/24 1629 08/12/24 0652 08/13/24 0422  WBC 18.9* 17.6* 16.2*  HGB 11.7* 9.7* 10.4*  HCT 35.2* 29.1* 30.5*  MCV 88.2 89.0 86.9  PLT 354 287 364   Basic Metabolic Panel: Recent Labs  Lab 08/11/24 1629  08/12/24 0652 08/13/24 0422  NA 134* 135 135  K 3.5 3.5 3.4*  CL 97* 104 101  CO2 24 18* 20*  GLUCOSE 89 68* 70  BUN 7 8 5*  CREATININE 0.71 0.67 0.59  CALCIUM  9.1 8.2* 8.6*   GFR: Estimated Creatinine Clearance: 74.8 mL/min (by C-G formula based on SCr of 0.59 mg/dL). Liver Function Tests: Recent Labs  Lab 08/11/24 1629  AST 21  ALT 26  ALKPHOS 75  BILITOT 1.0  PROT 7.9  ALBUMIN 3.7   Recent Labs  Lab 08/11/24 1629  LIPASE 26   No results for input(s): AMMONIA in the last 168 hours. Coagulation Profile: No results for input(s): INR, PROTIME  in the last 168 hours. Cardiac Enzymes: No results for input(s): CKTOTAL, CKMB, CKMBINDEX, TROPONINI in the last 168 hours. BNP (last 3 results) No results for input(s): PROBNP in the last 8760 hours. HbA1C: No results for input(s): HGBA1C in the last 72 hours. CBG: No results for input(s): GLUCAP in the last 168 hours. Lipid Profile: No results for input(s): CHOL, HDL, LDLCALC, TRIG, CHOLHDL, LDLDIRECT in the last 72 hours. Thyroid Function Tests: No results for input(s): TSH, T4TOTAL, FREET4, T3FREE, THYROIDAB in the last 72 hours. Anemia Panel: No results for input(s): VITAMINB12, FOLATE, FERRITIN, TIBC, IRON, RETICCTPCT in the last 72 hours. Sepsis Labs: No results for input(s): PROCALCITON, LATICACIDVEN in the last 168 hours.  Recent Results (from the past 240 hours)  Urine Culture     Status: Abnormal   Collection Time: 08/11/24  4:29 PM   Specimen: Urine, Clean Catch  Result Value Ref Range Status   Specimen Description URINE, CLEAN CATCH  Final   Special Requests   Final    NONE Performed at [facility], [address], [place], [place] [address]    Culture MULTIPLE SPECIES PRESENT, SUGGEST RECOLLECTION (A)  Final   Report Status 08/12/2024 FINAL  Final  Urine Culture     Status: Abnormal   Collection Time: 08/11/24  5:41 PM   Specimen: Urine, Clean Catch  Result Value Ref Range Status   Specimen Description URINE, CLEAN CATCH  Final   Special Requests   Final    NONE Performed at [facility], [address] [place], [place] [address]    Culture MULTIPLE SPECIES PRESENT, SUGGEST RECOLLECTION (A)  Final   Report Status 08/12/2024 FINAL  Final  Wet prep, genital     Status: Abnormal   Collection Time: 08/11/24  6:55 PM  Result Value Ref Range Status   Yeast Wet Prep HPF POC NONE SEEN NONE SEEN Final   Trich, Wet Prep NONE SEEN NONE SEEN Final   Clue Cells Wet Prep HPF POC PRESENT (A) NONE SEEN Final    WBC, Wet Prep HPF POC >=10 (A) <10 Final   Sperm NONE SEEN  Final    Comment: Performed at [facility], [address], [place], [place] [address]  Blood culture (routine x 2)     Status: None (Preliminary result)   Collection Time: 08/11/24  9:51 PM   Specimen: BLOOD RIGHT ARM  Result Value Ref Range Status   Specimen Description BLOOD RIGHT ARM  Final   Special Requests   Final    BOTTLES DRAWN AEROBIC ONLY Blood Culture adequate volume   Culture   Final    NO GROWTH 2 DAYS Performed at [facility], [address] [place], [place] [address]    Report Status PENDING  Incomplete  Blood culture (routine x 2)     Status: None (Preliminary result)   Collection Time: 08/11/24  9:52 PM   Specimen: BLOOD  Result Value Ref Range Status   Specimen Description BLOOD BLOOD LEFT ARM  Final   Special Requests   Final    BOTTLES DRAWN AEROBIC AND ANAEROBIC Blood Culture adequate volume   Culture   Final    NO GROWTH 2 DAYS Performed at [facility], [address], [place], [place] [address]    Report Status PENDING  Incomplete         Radiology Studies: US  Pelvis Complete Addendum Date: 08/11/2024 ADDENDUM REPORT: 08/11/2024 20:43 ADDENDUM: Upon further evaluation an IUD is in place. This is confirmed on recent abdomen and pelvis CT (dated August 11, 2024). Electronically Signed   By: Suzen Dials M.D.   On: 08/11/2024 20:43   Result Date: 08/11/2024 CLINICAL DATA:  Pelvic pain. EXAM: TRANSABDOMINAL AND TRANSVAGINAL ULTRASOUND OF PELVIS DOPPLER ULTRASOUND OF OVARIES TECHNIQUE: Both transabdominal and transvaginal ultrasound examinations of the pelvis were performed. Transabdominal technique was performed for global imaging of the pelvis including uterus, ovaries, adnexal regions, and pelvic cul-de-sac. It was necessary to proceed with endovaginal exam following the transabdominal exam to visualize the uterus, endometrium, bilateral ovaries and bilateral adnexa.  Color and duplex Doppler ultrasound was utilized to evaluate blood flow to the ovaries. COMPARISON:  None Available. FINDINGS: Uterus Measurements: 6.9 cm x 4.9 cm x 4.0 cm = volume: 79.0 mL. No fibroids or other mass visualized. Endometrium Thickness: 3.0 mm.  No focal abnormality visualized. Right ovary Measurements: 5.5 cm x 3.7 cm x 4.6 cm = volume: 47.7 mL. Normal appearance/no adnexal mass. Left ovary Measurements: 4.0 cm x 2.1 cm x 3.3 cm = volume: 14.9 mL. Normal appearance/no adnexal mass. Pulsed Doppler evaluation of both ovaries demonstrates normal low-resistance arterial and venous waveforms. Other findings There is a small amount of pelvic free fluid, likely physiologic. The study is technically limited secondary to patient pain, as per the ultrasound technologist. IMPRESSION: Unremarkable pelvic ultrasound. Electronically Signed: By: Suzen Dials M.D. On: 08/11/2024 20:06   US  Transvaginal Non-OB Addendum Date: 08/11/2024 ADDENDUM REPORT: 08/11/2024 20:43 ADDENDUM: Upon further evaluation an IUD is in place. This is confirmed on recent abdomen and pelvis CT (dated August 11, 2024). Electronically Signed   By: Suzen Dials M.D.   On: 08/11/2024 20:43   Result Date: 08/11/2024 CLINICAL DATA:  Pelvic pain. EXAM: TRANSABDOMINAL AND TRANSVAGINAL ULTRASOUND OF PELVIS DOPPLER ULTRASOUND OF OVARIES TECHNIQUE: Both transabdominal and transvaginal ultrasound examinations of the pelvis were performed. Transabdominal technique was performed for global imaging of the pelvis including uterus, ovaries, adnexal regions, and pelvic cul-de-sac. It was necessary to proceed with endovaginal exam following the transabdominal exam to visualize the uterus, endometrium, bilateral ovaries and bilateral adnexa. Color and duplex Doppler ultrasound was utilized to evaluate blood flow to the ovaries. COMPARISON:  None Available. FINDINGS: Uterus Measurements: 6.9 cm x 4.9 cm x 4.0 cm = volume: 79.0 mL. No fibroids  or other mass visualized. Endometrium Thickness: 3.0 mm.  No focal abnormality visualized. Right ovary Measurements: 5.5 cm x 3.7 cm x 4.6 cm = volume: 47.7 mL. Normal appearance/no adnexal mass. Left ovary Measurements: 4.0 cm x 2.1 cm x 3.3 cm = volume: 14.9 mL. Normal appearance/no adnexal mass. Pulsed Doppler evaluation of both ovaries demonstrates normal low-resistance arterial and venous waveforms. Other findings There is a small amount of pelvic free fluid, likely physiologic.  The study is technically limited secondary to patient pain, as per the ultrasound technologist. IMPRESSION: Unremarkable pelvic ultrasound. Electronically Signed: By: Suzen Dials M.D. On: 08/11/2024 20:06   US  Art/Ven Flow Abd Pelv Doppler Addendum Date: 08/11/2024 ADDENDUM REPORT: 08/11/2024 20:43 ADDENDUM: Upon further evaluation an IUD is in place. This is confirmed on recent abdomen and pelvis CT (dated August 11, 2024). Electronically Signed   By: Suzen Dials M.D.   On: 08/11/2024 20:43   Result Date: 08/11/2024 CLINICAL DATA:  Pelvic pain. EXAM: TRANSABDOMINAL AND TRANSVAGINAL ULTRASOUND OF PELVIS DOPPLER ULTRASOUND OF OVARIES TECHNIQUE: Both transabdominal and transvaginal ultrasound examinations of the pelvis were performed. Transabdominal technique was performed for global imaging of the pelvis including uterus, ovaries, adnexal regions, and pelvic cul-de-sac. It was necessary to proceed with endovaginal exam following the transabdominal exam to visualize the uterus, endometrium, bilateral ovaries and bilateral adnexa. Color and duplex Doppler ultrasound was utilized to evaluate blood flow to the ovaries. COMPARISON:  None Available. FINDINGS: Uterus Measurements: 6.9 cm x 4.9 cm x 4.0 cm = volume: 79.0 mL. No fibroids or other mass visualized. Endometrium Thickness: 3.0 mm.  No focal abnormality visualized. Right ovary Measurements: 5.5 cm x 3.7 cm x 4.6 cm = volume: 47.7 mL. Normal appearance/no adnexal  mass. Left ovary Measurements: 4.0 cm x 2.1 cm x 3.3 cm = volume: 14.9 mL. Normal appearance/no adnexal mass. Pulsed Doppler evaluation of both ovaries demonstrates normal low-resistance arterial and venous waveforms. Other findings There is a small amount of pelvic free fluid, likely physiologic. The study is technically limited secondary to patient pain, as per the ultrasound technologist. IMPRESSION: Unremarkable pelvic ultrasound. Electronically Signed: By: Suzen Dials M.D. On: 08/11/2024 20:06   CT ABDOMEN PELVIS W CONTRAST Result Date: 08/11/2024 EXAM: CT ABDOMEN AND PELVIS WITH CONTRAST 08/11/2024 06:07:58 PM TECHNIQUE: CT of the abdomen and pelvis was performed with the administration of 75 mL of iohexol (OMNIPAQUE) 350 MG/ML injection. Multiplanar reformatted images are provided for review. Automated exposure control, iterative reconstruction, and/or weight-based adjustment of the mA/kV was utilized to reduce the radiation dose to as low as reasonably achievable. COMPARISON: None available. CLINICAL HISTORY: Abdominal pain, acute, nonlocalized. FINDINGS: LOWER CHEST: No acute abnormality. LIVER: The liver is unremarkable. GALLBLADDER AND BILE DUCTS: Gallbladder is unremarkable. No biliary ductal dilatation. SPLEEN: No acute abnormality. PANCREAS: No acute abnormality. ADRENAL GLANDS: No acute abnormality. KIDNEYS, URETERS AND BLADDER: No stones in the kidneys or ureters. No hydronephrosis. No perinephric or periureteral stranding. Urinary bladder is unremarkable. GI AND BOWEL: Stomach demonstrates no acute abnormality. There is no bowel obstruction. Appendix cannot be definitively visualized PERITONEUM AND RETROPERITONEUM: Small amount of free fluid in the pelvis. No free air. VASCULATURE: Aorta is normal in caliber. LYMPH NODES: No lymphadenopathy. REPRODUCTIVE ORGANS: Inflammatory process in the right lower quadrant adjacent to the right side of the uterus, containing multiple cystic areas, the  largest 2 cm. This process seems to be closer to the uterus than the cecum and ileocecal valve; therefore, an ovarian process, possibly a tubo-ovarian abscess, is favored. An IUD is present in the uterus. BONES AND SOFT TISSUES: No acute osseous abnormality. No focal soft tissue abnormality. IMPRESSION: 1. Inflammatory process in the right lower quadrant with multiple cystic areas up to 2 cm, favored ovarian in origin such as a tubo-ovarian abscess; ruptured appendicitis cannot be excluded due to nonvisualization of the appendix. 2. Small amount of free pelvic fluid. Electronically signed by: Franky Crease MD 08/11/2024 06:24 PM EDT RP Workstation:  [id number]        Scheduled Meds:  melatonin  3 mg Oral QHS   Continuous Infusions:  cefTRIAXone (ROCEPHIN)  IV 1 g (08/12/24 1954)   doxycycline (VIBRAMYCIN) IV 100 mg (08/13/24 1047)   metronidazole 500 mg (08/13/24 1042)     LOS: 2 days    Time spent: 45 minutes spent on chart review, discussion with nursing staff, consultants, updating family and interview/physical exam; more than 50% of that time was spent in counseling and/or coordination of care.    Harlene RAYMOND Bowl, DO [facility] Available via Epic secure chat 7am-7pm After these hours, please refer to coverage provider listed on [email or web address] 08/13/2024, 11:36 AM

## 2024-08-13 NOTE — Plan of Care (Signed)

## 2024-08-13 NOTE — Plan of Care (Signed)
   Problem: Health Behavior/Discharge Planning: Goal: Ability to manage health-related needs will improve Outcome: Progressing   Problem: Clinical Measurements: Goal: Ability to maintain clinical measurements within normal limits will improve Outcome: Progressing Goal: Will remain free from infection Outcome: Progressing Goal: Diagnostic test results will improve Outcome: Progressing

## 2024-08-14 DIAGNOSIS — R1030 Lower abdominal pain, unspecified: Secondary | ICD-10-CM | POA: Diagnosis not present

## 2024-08-14 DIAGNOSIS — N760 Acute vaginitis: Secondary | ICD-10-CM | POA: Diagnosis not present

## 2024-08-14 DIAGNOSIS — N739 Female pelvic inflammatory disease, unspecified: Secondary | ICD-10-CM | POA: Diagnosis not present

## 2024-08-14 DIAGNOSIS — D649 Anemia, unspecified: Secondary | ICD-10-CM | POA: Diagnosis not present

## 2024-08-14 LAB — CBC
HCT: 30.1 % — ABNORMAL LOW (ref 36.0–46.0)
Hemoglobin: 10.2 g/dL — ABNORMAL LOW (ref 12.0–15.0)
MCH: 29.4 pg (ref 26.0–34.0)
MCHC: 33.9 g/dL (ref 30.0–36.0)
MCV: 86.7 fL (ref 80.0–100.0)
Platelets: 382 K/uL (ref 150–400)
RBC: 3.47 MIL/uL — ABNORMAL LOW (ref 3.87–5.11)
RDW: 11.9 % (ref 11.5–15.5)
WBC: 10.6 K/uL — ABNORMAL HIGH (ref 4.0–10.5)
nRBC: 0 % (ref 0.0–0.2)

## 2024-08-14 LAB — BASIC METABOLIC PANEL WITH GFR
Anion gap: 12 (ref 5–15)
BUN: 5 mg/dL — ABNORMAL LOW (ref 6–20)
CO2: 20 mmol/L — ABNORMAL LOW (ref 22–32)
Calcium: 8.5 mg/dL — ABNORMAL LOW (ref 8.9–10.3)
Chloride: 101 mmol/L (ref 98–111)
Creatinine, Ser: 0.67 mg/dL (ref 0.44–1.00)
GFR, Estimated: >60 mL/min (ref >60)
Glucose, Bld: 83 mg/dL (ref 70–99)
Potassium: 3.9 mmol/L (ref 3.5–5.1)
Sodium: 133 mmol/L — ABNORMAL LOW (ref 135–145)

## 2024-08-14 MED ORDER — DOXYCYCLINE HYCLATE 100 MG PO TABS
100.0000 mg | ORAL_TABLET | Freq: Two times a day (BID) | ORAL | Status: DC
  Administered 2024-08-14 – 2024-08-15 (×2): 100 mg via ORAL
  Filled 2024-08-14 (×2): qty 1

## 2024-08-14 MED ORDER — ONDANSETRON HCL 4 MG/2ML IJ SOLN
4.0000 mg | Freq: Four times a day (QID) | INTRAMUSCULAR | Status: DC | PRN
  Administered 2024-08-14: 4 mg via INTRAVENOUS
  Filled 2024-08-14: qty 2

## 2024-08-14 MED ORDER — METRONIDAZOLE 500 MG PO TABS
500.0000 mg | ORAL_TABLET | Freq: Two times a day (BID) | ORAL | Status: DC
  Administered 2024-08-14 – 2024-08-15 (×2): 500 mg via ORAL
  Filled 2024-08-14 (×2): qty 1

## 2024-08-14 NOTE — Progress Notes (Signed)
 Pain and WBCs better, tolerating clears-- will advance to fulls. JV

## 2024-08-14 NOTE — Plan of Care (Addendum)
 Advanced to full liquid diet, pt reports n/v when trying grits.  Advised patient to call me before meal to premedicate with antiemetics.  Fluids d/c. Prn tylenol  given x1 for abdominal pain.  Encouraged patient to ambulate out of room.    Problem: Education: Goal: Knowledge of General Education information will improve Description: Including pain rating scale, medication(s)/side effects and non-pharmacologic comfort measures Outcome: Progressing   Problem: Health Behavior/Discharge Planning: Goal: Ability to manage health-related needs will improve Outcome: Progressing   Problem: Clinical Measurements: Goal: Ability to maintain clinical measurements within normal limits will improve Outcome: Progressing Goal: Will remain free from infection Outcome: Progressing Goal: Diagnostic test results will improve Outcome: Progressing Goal: Respiratory complications will improve Outcome: Progressing Goal: Cardiovascular complication will be avoided Outcome: Progressing

## 2024-08-14 NOTE — Progress Notes (Signed)
 PID on rocephin/doxy and flagyl  Subjective: Patient reports tolerating PO and no problems voiding.  Had 1 episode of emesis   Objective: I have reviewed patient's vital signs, intake and output, medications, labs, microbiology, and radiology results.  General: alert, cooperative, and no distress GI: soft, non-tender; bowel sounds normal; no masses,  no organomegaly minimally tender negative exam     Latest Ref Rng & Units 08/14/2024    5:38 AM 08/13/2024    4:22 AM 08/12/2024    6:52 AM  CBC  WBC 4.0 - 10.5 K/uL 10.6  16.2  17.6   Hemoglobin 12.0 - 15.0 g/dL 89.7  89.5  9.7   Hematocrit 36.0 - 46.0 % 30.1  30.5  29.1   Platelets 150 - 400 K/uL 382  364  287     Assessment/Plan: PID: big drop in WBC today, will be status post 3 days IV rocephin tomorrow am, recommend discharge if stable on oral doxycycline + flagyl  There is no TOA, CT findings were not confirmed on sonogram which is a better eval for that  LOS: 3 days    Olivia VEAR Inch, MD 08/14/2024, 3:27 PM

## 2024-08-14 NOTE — Progress Notes (Signed)
 As per Dr. Vann, advance patient to soft diet when tolerates full liquid. Antiemetic given prior to eating. Awaiting to see if she tolerates meal. Report given to night shift nurse.

## 2024-08-14 NOTE — Progress Notes (Signed)
 PROGRESS NOTE    Olivia Farrell  FMW:[id number] DOB: [birth year] DOA: 08/11/2024 PCP: Sherrilee Graven, MD    Brief Narrative:  Olivia Farrell is a 33 y.o. female with medical history significant of anemia, preeclampsia presenting with a chief complaint of lower abdominal pain.  Finally starting to feel better, diet being advanced, changed her to oral antibiotics anticipation of discharge in the next 24 hours.   Assessment and Plan: Lower abdominal pain Bacterial vaginosis/PID ?ruptured appendicitis - WBC count highly close to normal - Pelvic exam done by ED PA positive for yellow colored vaginal discharge and cervical motion tenderness.  Wet prep showing clue cells and >10 WBCs.  GC chlamydia probe pending.  - CT abdomen pelvis with contrast showing inflammatory process in the right lower quadrant with multiple cystic areas up to 2 cm suspicious for tubo-ovarian abscess versus possible ruptured appendicitis.   - OB/GYN and general surgery consulted.  --Per Gyn upon his exam did not appreciate cervical motion tenderness.  -Changed to oral antibiotics in anticipation of discharge - Advance diet   ?UTI UA with evidence of pyuria and bacteria but patient is not endorsing any urinary symptoms.  Already started on ceftriaxone -Urine culture with multiple species   Mild hypovolemic hyponatremia -Resolved   Hypoglycemia - Stop D5 as able to eat  Chronic normocytic anemia -Trend-no sign of bleeding  Hypokalemia - Replete  DVT prophylaxis: SCDs Start: 08/11/24 2128    Code Status: Full Code   Disposition Plan:  Level of care: Med-Surg Status is: Inpatient     Consultants:  GYN Surgery    Subjective: Overall pain much improved and feeling better  Objective: Vitals:   08/13/24 1741 08/13/24 2122 08/14/24 0536 08/14/24 1049  BP: 105/63 109/71 102/63 107/72  Pulse: 80 89 83 68  Resp: 17 16 17 18   Temp: 99.2 F (37.3 C) 99.1 F (37.3 C) 98.5 F (36.9 C)  98.2 F (36.8 C)  TempSrc: Oral Oral Oral Oral  SpO2: 100% 100% 100% 100%  Weight:      Height:        Intake/Output Summary (Last 24 hours) at 08/14/2024 1421 Last data filed at 08/14/2024 0900 Gross per 24 hour  Intake 1173.15 ml  Output --  Net 1173.15 ml   Filed Weights   08/11/24 1610  Weight: 49 kg    Examination:   General: Appearance:    Well developed, well nourished female in no acute distress     Lungs:     respirations unlabored  Heart:    Normal heart rate.   MS:   All extremities are intact.    Neurologic:   Awake, alert       Data Reviewed: I have personally reviewed following labs and imaging studies  CBC: Recent Labs  Lab 08/11/24 1629 08/12/24 0652 08/13/24 0422 08/14/24 0538  WBC 18.9* 17.6* 16.2* 10.6*  HGB 11.7* 9.7* 10.4* 10.2*  HCT 35.2* 29.1* 30.5* 30.1*  MCV 88.2 89.0 86.9 86.7  PLT 354 287 364 382   Basic Metabolic Panel: Recent Labs  Lab 08/11/24 1629 08/12/24 0652 08/13/24 0422 08/14/24 0538  NA 134* 135 135 133*  K 3.5 3.5 3.4* 3.9  CL 97* 104 101 101  CO2 24 18* 20* 20*  GLUCOSE 89 68* 70 83  BUN 7 8 5* 5*  CREATININE 0.71 0.67 0.59 0.67  CALCIUM  9.1 8.2* 8.6* 8.5*   GFR: Estimated Creatinine Clearance: 74.8 mL/min (by C-G formula based on SCr of 0.67  mg/dL). Liver Function Tests: Recent Labs  Lab 08/11/24 1629  AST 21  ALT 26  ALKPHOS 75  BILITOT 1.0  PROT 7.9  ALBUMIN 3.7   Recent Labs  Lab 08/11/24 1629  LIPASE 26   No results for input(s): AMMONIA in the last 168 hours. Coagulation Profile: No results for input(s): INR, PROTIME in the last 168 hours. Cardiac Enzymes: No results for input(s): CKTOTAL, CKMB, CKMBINDEX, TROPONINI in the last 168 hours. BNP (last 3 results) No results for input(s): PROBNP in the last 8760 hours. HbA1C: No results for input(s): HGBA1C in the last 72 hours. CBG: No results for input(s): GLUCAP in the last 168 hours. Lipid Profile: No results  for input(s): CHOL, HDL, LDLCALC, TRIG, CHOLHDL, LDLDIRECT in the last 72 hours. Thyroid Function Tests: No results for input(s): TSH, T4TOTAL, FREET4, T3FREE, THYROIDAB in the last 72 hours. Anemia Panel: No results for input(s): VITAMINB12, FOLATE, FERRITIN, TIBC, IRON, RETICCTPCT in the last 72 hours. Sepsis Labs: No results for input(s): PROCALCITON, LATICACIDVEN in the last 168 hours.  Recent Results (from the past 240 hours)  Urine Culture     Status: Abnormal   Collection Time: 08/11/24  4:29 PM   Specimen: Urine, Clean Catch  Result Value Ref Range Status   Specimen Description URINE, CLEAN CATCH  Final   Special Requests   Final    NONE Performed at [facility], [address], [place], [place] [address]    Culture MULTIPLE SPECIES PRESENT, SUGGEST RECOLLECTION (A)  Final   Report Status 08/12/2024 FINAL  Final  Urine Culture     Status: Abnormal   Collection Time: 08/11/24  5:41 PM   Specimen: Urine, Clean Catch  Result Value Ref Range Status   Specimen Description URINE, CLEAN CATCH  Final   Special Requests   Final    NONE Performed at [facility], [address], [place], [place] [address]    Culture MULTIPLE SPECIES PRESENT, SUGGEST RECOLLECTION (A)  Final   Report Status 08/12/2024 FINAL  Final  Wet prep, genital     Status: Abnormal   Collection Time: 08/11/24  6:55 PM  Result Value Ref Range Status   Yeast Wet Prep HPF POC NONE SEEN NONE SEEN Final   Trich, Wet Prep NONE SEEN NONE SEEN Final   Clue Cells Wet Prep HPF POC PRESENT (A) NONE SEEN Final   WBC, Wet Prep HPF POC >=10 (A) <10 Final   Sperm NONE SEEN  Final    Comment: Performed at [facility], [address] [place], [place] [address]  Blood culture (routine x 2)     Status: None (Preliminary result)   Collection Time: 08/11/24  9:51 PM   Specimen: BLOOD RIGHT ARM  Result Value Ref Range Status   Specimen Description BLOOD RIGHT ARM   Final   Special Requests   Final    BOTTLES DRAWN AEROBIC ONLY Blood Culture adequate volume   Culture   Final    NO GROWTH 3 DAYS Performed at [facility], [address] [place], [place] [address]    Report Status PENDING  Incomplete  Blood culture (routine x 2)     Status: None (Preliminary result)   Collection Time: 08/11/24  9:52 PM   Specimen: BLOOD  Result Value Ref Range Status   Specimen Description BLOOD BLOOD LEFT ARM  Final   Special Requests   Final    BOTTLES DRAWN AEROBIC AND ANAEROBIC Blood  Culture adequate volume   Culture   Final    NO GROWTH 3 DAYS Performed at [facility], [address], [place], [place] [address]    Report Status PENDING  Incomplete         Radiology Studies: No results found.       Scheduled Meds:  doxycycline  100 mg Oral Q12H   melatonin  3 mg Oral QHS   metroNIDAZOLE  500 mg Oral Q12H   Continuous Infusions:  cefTRIAXone (ROCEPHIN)  IV 1 g (08/13/24 2124)     LOS: 3 days    Time spent: 45 minutes spent on chart review, discussion with nursing staff, consultants, updating family and interview/physical exam; more than 50% of that time was spent in counseling and/or coordination of care.    Harlene RAYMOND Bowl, DO [facility] Available via Epic secure chat 7am-7pm After these hours, please refer to coverage provider listed on [email or web address] 08/14/2024, 2:21 PM

## 2024-08-15 ENCOUNTER — Other Ambulatory Visit (HOSPITAL_COMMUNITY): Payer: Self-pay

## 2024-08-15 ENCOUNTER — Encounter (HOSPITAL_COMMUNITY): Payer: Self-pay | Admitting: Internal Medicine

## 2024-08-15 DIAGNOSIS — N760 Acute vaginitis: Principal | ICD-10-CM

## 2024-08-15 DIAGNOSIS — D649 Anemia, unspecified: Secondary | ICD-10-CM

## 2024-08-15 DIAGNOSIS — N739 Female pelvic inflammatory disease, unspecified: Secondary | ICD-10-CM | POA: Diagnosis not present

## 2024-08-15 DIAGNOSIS — R1030 Lower abdominal pain, unspecified: Secondary | ICD-10-CM

## 2024-08-15 DIAGNOSIS — E871 Hypo-osmolality and hyponatremia: Secondary | ICD-10-CM

## 2024-08-15 LAB — CBC
HCT: 29.3 % — ABNORMAL LOW (ref 36.0–46.0)
Hemoglobin: 10.2 g/dL — ABNORMAL LOW (ref 12.0–15.0)
MCH: 29.8 pg (ref 26.0–34.0)
MCHC: 34.8 g/dL (ref 30.0–36.0)
MCV: 85.7 fL (ref 80.0–100.0)
Platelets: 423 K/uL — ABNORMAL HIGH (ref 150–400)
RBC: 3.42 MIL/uL — ABNORMAL LOW (ref 3.87–5.11)
RDW: 11.9 % (ref 11.5–15.5)
WBC: 8.4 K/uL (ref 4.0–10.5)
nRBC: 0 % (ref 0.0–0.2)

## 2024-08-15 MED ORDER — METRONIDAZOLE 500 MG PO TABS
500.0000 mg | ORAL_TABLET | Freq: Two times a day (BID) | ORAL | 0 refills | Status: AC
  Filled 2024-08-15: qty 22, 11d supply, fill #0

## 2024-08-15 MED ORDER — DOXYCYCLINE HYCLATE 100 MG PO TABS
100.0000 mg | ORAL_TABLET | Freq: Two times a day (BID) | ORAL | 0 refills | Status: AC
  Filled 2024-08-15: qty 22, 11d supply, fill #0

## 2024-08-15 NOTE — Discharge Summary (Signed)
 Physician Discharge Summary   Patient: Olivia Farrell MRN: [id number] DOB: 07/06/1991  Admit date:     08/11/2024  Discharge date: 08/15/24  Discharge Physician: Lebron JINNY Cage   PCP: Sherrilee Graven, MD   Recommendations at discharge:   Follow-up with gynecology as scheduled Follow-up with PCP in 1 week  Discharge Diagnoses: Principal Problem:   Lower abdominal pain Active Problems:   Bacterial vaginosis   Hyponatremia   Normocytic anemia   PID (acute pelvic inflammatory disease)    Hospital Course: Olivia Farrell is a 33 y.o. female with medical history significant of anemia, preeclampsia presenting with a chief complaint of lower abdominal pain, likely 2/2 PID/bacterial vaginosis.  OB/GYN consulted, recommend continuing antibiotics for total of 14 days.  Patient reports feeling much better, denies any new complaints, able to tolerate diet very eager to be discharged.      Assessment and Plan: Lower abdominal pain likely 2/2 PID Currently afebrile, with resolved leukocytosis Pelvic exam done by ED PA positive for yellow colored vaginal discharge and cervical motion tenderness.  Wet prep showing clue cells and >10 WBCs.  GC chlamydia probe negative. CT abdomen pelvis with contrast showing inflammatory process in the right lower quadrant with multiple cystic areas up to 2 cm suspicious for tubo-ovarian abscess versus possible ruptured appendicitis General Surgery consulted, likely ruptured appendicitis, signed off OB/GYN consulted, likely PID, noted malodorous yellow vaginal discharge upon examination Patient noted to have 34 year old Mirena IUD which was removed by OB/GYN on 11/1 Discharged on oral antibiotics doxycycline, Flagyl for total of 14 days Recommend follow-up with OB/GYN   ?UTI UA with evidence of pyuria and bacteria but patient is not endorsing any urinary symptoms Urine culture with multiple species Patient received 3 days of IV ceftriaxone    Mild hypovolemic hyponatremia Improved   Hypoglycemia Resolved   Chronic normocytic anemia CBC stable Patient follow-up   Hypokalemia Resolved   Consultants: General surgery, OB/GYN Procedures performed: IUD removal on 08/11/2024 Disposition: Home Diet recommendation:  Regular diet   DISCHARGE MEDICATION: Allergies as of 08/15/2024   No Known Allergies      Medication List     TAKE these medications    acetaminophen  500 MG tablet Commonly known as: TYLENOL  Take 1,000 mg by mouth 2 (two) times daily as needed for headache or fever (pain).   doxycycline 100 MG tablet Commonly known as: VIBRA-TABS Take 1 tablet (100 mg total) by mouth 2 (two) times daily for 11 days.   metroNIDAZOLE 500 MG tablet Commonly known as: FLAGYL Take 1 tablet (500 mg total) by mouth 2 (two) times daily for 11 days.        Follow-up Information     McKenzie, Graven, MD. Schedule an appointment as soon as possible for a visit in 1 week(s).   Specialty: Family Medicine Contact information: [address] [facility] [place] [address] [phone]                Discharge Exam: Olivia Farrell   08/11/24 1610  Weight: 49 kg   General: NAD  Cardiovascular: S1, S2 present Respiratory: CTAB Abdomen: Soft, nontender, nondistended, bowel sounds present Musculoskeletal: No bilateral pedal edema noted Skin: Normal Psychiatry: Normal mood   Condition at discharge: stable  The results of significant diagnostics from this hospitalization (including imaging, microbiology, ancillary and laboratory) are listed below for reference.   Imaging Studies: US  Pelvis Complete Addendum Date: 08/11/2024 ADDENDUM REPORT: 08/11/2024 20:43 ADDENDUM: Upon further evaluation an IUD is in  place. This is confirmed on recent abdomen and pelvis CT (dated August 11, 2024). Electronically Signed   By: Suzen Dials M.D.   On: 08/11/2024 20:43   Result Date: 08/11/2024 CLINICAL DATA:  Pelvic  pain. EXAM: TRANSABDOMINAL AND TRANSVAGINAL ULTRASOUND OF PELVIS DOPPLER ULTRASOUND OF OVARIES TECHNIQUE: Both transabdominal and transvaginal ultrasound examinations of the pelvis were performed. Transabdominal technique was performed for global imaging of the pelvis including uterus, ovaries, adnexal regions, and pelvic cul-de-sac. It was necessary to proceed with endovaginal exam following the transabdominal exam to visualize the uterus, endometrium, bilateral ovaries and bilateral adnexa. Color and duplex Doppler ultrasound was utilized to evaluate blood flow to the ovaries. COMPARISON:  None Available. FINDINGS: Uterus Measurements: 6.9 cm x 4.9 cm x 4.0 cm = volume: 79.0 mL. No fibroids or other mass visualized. Endometrium Thickness: 3.0 mm.  No focal abnormality visualized. Right ovary Measurements: 5.5 cm x 3.7 cm x 4.6 cm = volume: 47.7 mL. Normal appearance/no adnexal mass. Left ovary Measurements: 4.0 cm x 2.1 cm x 3.3 cm = volume: 14.9 mL. Normal appearance/no adnexal mass. Pulsed Doppler evaluation of both ovaries demonstrates normal low-resistance arterial and venous waveforms. Other findings There is a small amount of pelvic free fluid, likely physiologic. The study is technically limited secondary to patient pain, as per the ultrasound technologist. IMPRESSION: Unremarkable pelvic ultrasound. Electronically Signed: By: Suzen Dials M.D. On: 08/11/2024 20:06   US  Transvaginal Non-OB Addendum Date: 08/11/2024 ADDENDUM REPORT: 08/11/2024 20:43 ADDENDUM: Upon further evaluation an IUD is in place. This is confirmed on recent abdomen and pelvis CT (dated August 11, 2024). Electronically Signed   By: Suzen Dials M.D.   On: 08/11/2024 20:43   Result Date: 08/11/2024 CLINICAL DATA:  Pelvic pain. EXAM: TRANSABDOMINAL AND TRANSVAGINAL ULTRASOUND OF PELVIS DOPPLER ULTRASOUND OF OVARIES TECHNIQUE: Both transabdominal and transvaginal ultrasound examinations of the pelvis were performed.  Transabdominal technique was performed for global imaging of the pelvis including uterus, ovaries, adnexal regions, and pelvic cul-de-sac. It was necessary to proceed with endovaginal exam following the transabdominal exam to visualize the uterus, endometrium, bilateral ovaries and bilateral adnexa. Color and duplex Doppler ultrasound was utilized to evaluate blood flow to the ovaries. COMPARISON:  None Available. FINDINGS: Uterus Measurements: 6.9 cm x 4.9 cm x 4.0 cm = volume: 79.0 mL. No fibroids or other mass visualized. Endometrium Thickness: 3.0 mm.  No focal abnormality visualized. Right ovary Measurements: 5.5 cm x 3.7 cm x 4.6 cm = volume: 47.7 mL. Normal appearance/no adnexal mass. Left ovary Measurements: 4.0 cm x 2.1 cm x 3.3 cm = volume: 14.9 mL. Normal appearance/no adnexal mass. Pulsed Doppler evaluation of both ovaries demonstrates normal low-resistance arterial and venous waveforms. Other findings There is a small amount of pelvic free fluid, likely physiologic. The study is technically limited secondary to patient pain, as per the ultrasound technologist. IMPRESSION: Unremarkable pelvic ultrasound. Electronically Signed: By: Suzen Dials M.D. On: 08/11/2024 20:06   US  Art/Ven Flow Abd Pelv Doppler Addendum Date: 08/11/2024 ADDENDUM REPORT: 08/11/2024 20:43 ADDENDUM: Upon further evaluation an IUD is in place. This is confirmed on recent abdomen and pelvis CT (dated August 11, 2024). Electronically Signed   By: Suzen Dials M.D.   On: 08/11/2024 20:43   Result Date: 08/11/2024 CLINICAL DATA:  Pelvic pain. EXAM: TRANSABDOMINAL AND TRANSVAGINAL ULTRASOUND OF PELVIS DOPPLER ULTRASOUND OF OVARIES TECHNIQUE: Both transabdominal and transvaginal ultrasound examinations of the pelvis were performed. Transabdominal technique was performed for global imaging of the pelvis including uterus,  ovaries, adnexal regions, and pelvic cul-de-sac. It was necessary to proceed with endovaginal exam  following the transabdominal exam to visualize the uterus, endometrium, bilateral ovaries and bilateral adnexa. Color and duplex Doppler ultrasound was utilized to evaluate blood flow to the ovaries. COMPARISON:  None Available. FINDINGS: Uterus Measurements: 6.9 cm x 4.9 cm x 4.0 cm = volume: 79.0 mL. No fibroids or other mass visualized. Endometrium Thickness: 3.0 mm.  No focal abnormality visualized. Right ovary Measurements: 5.5 cm x 3.7 cm x 4.6 cm = volume: 47.7 mL. Normal appearance/no adnexal mass. Left ovary Measurements: 4.0 cm x 2.1 cm x 3.3 cm = volume: 14.9 mL. Normal appearance/no adnexal mass. Pulsed Doppler evaluation of both ovaries demonstrates normal low-resistance arterial and venous waveforms. Other findings There is a small amount of pelvic free fluid, likely physiologic. The study is technically limited secondary to patient pain, as per the ultrasound technologist. IMPRESSION: Unremarkable pelvic ultrasound. Electronically Signed: By: Suzen Dials M.D. On: 08/11/2024 20:06   CT ABDOMEN PELVIS W CONTRAST Result Date: 08/11/2024 EXAM: CT ABDOMEN AND PELVIS WITH CONTRAST 08/11/2024 06:07:58 PM TECHNIQUE: CT of the abdomen and pelvis was performed with the administration of 75 mL of iohexol (OMNIPAQUE) 350 MG/ML injection. Multiplanar reformatted images are provided for review. Automated exposure control, iterative reconstruction, and/or weight-based adjustment of the mA/kV was utilized to reduce the radiation dose to as low as reasonably achievable. COMPARISON: None available. CLINICAL HISTORY: Abdominal pain, acute, nonlocalized. FINDINGS: LOWER CHEST: No acute abnormality. LIVER: The liver is unremarkable. GALLBLADDER AND BILE DUCTS: Gallbladder is unremarkable. No biliary ductal dilatation. SPLEEN: No acute abnormality. PANCREAS: No acute abnormality. ADRENAL GLANDS: No acute abnormality. KIDNEYS, URETERS AND BLADDER: No stones in the kidneys or ureters. No hydronephrosis. No  perinephric or periureteral stranding. Urinary bladder is unremarkable. GI AND BOWEL: Stomach demonstrates no acute abnormality. There is no bowel obstruction. Appendix cannot be definitively visualized PERITONEUM AND RETROPERITONEUM: Small amount of free fluid in the pelvis. No free air. VASCULATURE: Aorta is normal in caliber. LYMPH NODES: No lymphadenopathy. REPRODUCTIVE ORGANS: Inflammatory process in the right lower quadrant adjacent to the right side of the uterus, containing multiple cystic areas, the largest 2 cm. This process seems to be closer to the uterus than the cecum and ileocecal valve; therefore, an ovarian process, possibly a tubo-ovarian abscess, is favored. An IUD is present in the uterus. BONES AND SOFT TISSUES: No acute osseous abnormality. No focal soft tissue abnormality. IMPRESSION: 1. Inflammatory process in the right lower quadrant with multiple cystic areas up to 2 cm, favored ovarian in origin such as a tubo-ovarian abscess; ruptured appendicitis cannot be excluded due to nonvisualization of the appendix. 2. Small amount of free pelvic fluid. Electronically signed by: Franky Crease MD 08/11/2024 06:24 PM EDT RP Workstation: [id number]    Microbiology: Results for orders placed or performed during the hospital encounter of 08/11/24  Urine Culture     Status: Abnormal   Collection Time: 08/11/24  5:41 PM   Specimen: Urine, Clean Catch  Result Value Ref Range Status   Specimen Description URINE, CLEAN CATCH  Final   Special Requests   Final    NONE Performed at [facility], [address], [place], [place] [address]    Culture MULTIPLE SPECIES PRESENT, SUGGEST RECOLLECTION (A)  Final   Report Status 08/12/2024 FINAL  Final  Wet prep, genital     Status: Abnormal   Collection Time: 08/11/24  6:55 PM  Result Value Ref Range Status  Yeast Wet Prep HPF POC NONE SEEN NONE SEEN Final   Trich, Wet Prep NONE SEEN NONE SEEN Final   Clue Cells Wet Prep HPF POC PRESENT (A)  NONE SEEN Final   WBC, Wet Prep HPF POC >=10 (A) <10 Final   Sperm NONE SEEN  Final    Comment: Performed at [facility], [address] [place], [place] [address]  Blood culture (routine x 2)     Status: None (Preliminary result)   Collection Time: 08/11/24  9:51 PM   Specimen: BLOOD RIGHT ARM  Result Value Ref Range Status   Specimen Description BLOOD RIGHT ARM  Final   Special Requests   Final    BOTTLES DRAWN AEROBIC ONLY Blood Culture adequate volume   Culture   Final    NO GROWTH 4 DAYS Performed at [facility], [address] [place], [place] [address]    Report Status PENDING  Incomplete  Blood culture (routine x 2)     Status: None (Preliminary result)   Collection Time: 08/11/24  9:52 PM   Specimen: BLOOD  Result Value Ref Range Status   Specimen Description BLOOD BLOOD LEFT ARM  Final   Special Requests   Final    BOTTLES DRAWN AEROBIC AND ANAEROBIC Blood Culture adequate volume   Culture   Final    NO GROWTH 4 DAYS Performed at [facility], [address], [place], [place] [address]    Report Status PENDING  Incomplete    Labs: CBC: Recent Labs  Lab 08/11/24 1629 08/12/24 0652 08/13/24 0422 08/14/24 0538 08/15/24 0806  WBC 18.9* 17.6* 16.2* 10.6* 8.4  HGB 11.7* 9.7* 10.4* 10.2* 10.2*  HCT 35.2* 29.1* 30.5* 30.1* 29.3*  MCV 88.2 89.0 86.9 86.7 85.7  PLT 354 287 364 382 423*   Basic Metabolic Panel: Recent Labs  Lab 08/11/24 1629 08/12/24 0652 08/13/24 0422 08/14/24 0538  NA 134* 135 135 133*  K 3.5 3.5 3.4* 3.9  CL 97* 104 101 101  CO2 24 18* 20* 20*  GLUCOSE 89 68* 70 83  BUN 7 8 5* 5*  CREATININE 0.71 0.67 0.59 0.67  CALCIUM  9.1 8.2* 8.6* 8.5*   Liver Function Tests: Recent Labs  Lab 08/11/24 1629  AST 21  ALT 26  ALKPHOS 75  BILITOT 1.0  PROT 7.9  ALBUMIN 3.7   CBG: No results for input(s): GLUCAP in the last 168 hours.  Discharge time spent: greater than 30 minutes.  Signed: Lebron JINNY Cage,  MD [facility] 08/15/2024

## 2024-08-15 NOTE — Progress Notes (Signed)
 Gynecology Progress Note  Subjective: Patient reports tolerating PO and no problems voiding.  Minimal pain. No other concerns  Objective: I have reviewed patient's vital signs, intake and output, medications, labs, microbiology, and radiology results.  General: alert, cooperative, and no distress GI: soft, non-tender; bowel sounds normal; no masses,  no organomegaly minimally tender negative exam     Latest Ref Rng & Units 08/15/2024    8:06 AM 08/14/2024    5:38 AM 08/13/2024    4:22 AM  CBC  WBC 4.0 - 10.5 K/uL 8.4  10.6  16.2   Hemoglobin 12.0 - 15.0 g/dL 89.7  89.7  89.5   Hematocrit 36.0 - 46.0 % 29.3  30.1  30.5   Platelets 150 - 400 K/uL 423  382  364     Assessment/Plan: PID: Declining WBC and she is status post 3 days IV Rocephin Can be switched to oral Doxycycline and Metronidazole to complete a 14 day regimen. Patient already has follow up arranged in the [facility] office; was originally scheduled for 08/16/24 but this will be moved up about one week or so  as tomorrow is too soon for outpatient follow up. Patient can be discharged from a GYN standpoint.  Please let us  know if you have any further concerns.   LOS: 4 days    Gloris Hugger, MD 08/15/2024, 11:23 AM [phone]

## 2024-08-15 NOTE — ED Provider Notes (Signed)
 [facility]   [id number] 08/11/24 Arrival Time: 1343  ASSESSMENT & PLAN:  1. Lower abdominal pain    Lower abd TTP; recommend ED evaluation; to ED via POV; stable upon discharge. BP 107/71 (BP Location: Right Arm)   Pulse 100   Temp 98.4 F (36.9 C) (Oral)   Resp 16   SpO2 98%     Follow-up Information     Go to  [facility] Emergency Department at [facility].   Specialty: Emergency Medicine Contact information: [address] [place]  [address] [phone]                 Reviewed expectations re: course of current medical issues. Questions answered. Outlined signs and symptoms indicating need for more acute intervention. Patient verbalized understanding. After Visit Summary given.   SUBJECTIVE: History from: patient. Olivia Farrell is a 33 y.o. female who presents with complaint of lower abdominal pain; x 2 days; one emesis yesterday. Denies fever. No tx PTA.  No LMP recorded. (Menstrual status: Other). Past Surgical History:  Procedure Laterality Date   CESAREAN SECTION  08/17/2012   Procedure: CESAREAN SECTION;  Surgeon: Nathanel LELON Bunker, MD;  Location: [facility];  Service: Obstetrics;  Laterality: N/A;   IUD REMOVAL  08/11/2024     OBJECTIVE:  Vitals:   08/11/24 1433  BP: 107/71  Pulse: 100  Resp: 16  Temp: 98.4 F (36.9 C)  TempSrc: Oral  SpO2: 98%    General appearance: appears uncomfortable HEENT: [place]; AT; oropharynx moist Lungs: unlabored respirations Abdomen: soft; without distention; significant and poorly localized tenderness to palpation over lower abdomen, more on R; normal bowel sounds; without masses or organomegaly; without guarding or rebound tenderness Back: without reported CVA tenderness; FROM at waist Extremities: without LE edema; symmetrical; without gross deformities Skin: warm and dry Neurologic: normal gait Psychological: alert and cooperative; normal mood and  affect  Labs: Results for orders placed or performed during the hospital encounter of 08/11/24  POCT urinalysis dipstick   Collection Time: 08/11/24  2:41 PM  Result Value Ref Range   Color, UA other (A) yellow   Clarity, UA clear clear   Glucose, UA negative negative mg/dL   Bilirubin, UA small (A) negative   Ketones, POC UA large (80) (A) negative mg/dL   Spec Grav, UA >=8.969 (A) 1.010 - 1.025   Blood, UA trace-intact (A) negative   pH, UA 6.0 5.0 - 8.0   Protein Ur, POC =100 (A) negative mg/dL   Urobilinogen, UA 1.0 0.2 or 1.0 E.U./dL   Nitrite, UA Negative Negative   Leukocytes, UA Trace (A) Negative  POCT urine pregnancy   Collection Time: 08/11/24  2:47 PM  Result Value Ref Range   Preg Test, Ur Negative Negative  Urine Culture   Collection Time: 08/11/24  4:29 PM   Specimen: Urine, Clean Catch  Result Value Ref Range   Specimen Description URINE, CLEAN CATCH    Special Requests      NONE Performed at [facility], [address], [place], [place] [address]    Culture MULTIPLE SPECIES PRESENT, SUGGEST RECOLLECTION (A)    Report Status 08/12/2024 FINAL    Labs Reviewed  URINE CULTURE - Abnormal; Notable for the following components:      Result Value   Culture MULTIPLE SPECIES PRESENT, SUGGEST RECOLLECTION (*)    All other components within normal limits  POCT URINALYSIS DIP (MANUAL ENTRY) - Abnormal; Notable for the following components:  Color, UA other (*)    Bilirubin, UA small (*)    Ketones, POC UA large (80) (*)    Spec Grav, UA >=1.030 (*)    Blood, UA trace-intact (*)    Protein Ur, POC =100 (*)    Leukocytes, UA Trace (*)    All other components within normal limits  POCT URINE PREGNANCY    Imaging: No results found.   No Known Allergies                                             Past Medical History:  Diagnosis Date   Anemia    Preeclampsia 08/16/2012    Social History   Socioeconomic History   Marital status: Single    Spouse  name: Not on file   Number of children: Not on file   Years of education: Not on file   Highest education level: Not on file  Occupational History   Not on file  Tobacco Use   Smoking status: Never   Smokeless tobacco: Never  Vaping Use   Vaping status: Never Used  Substance and Sexual Activity   Alcohol use: Yes    Comment: rarely   Drug use: No   Sexual activity: Yes    Birth control/protection: I.U.D.  Other Topics Concern   Not on file  Social History Narrative   ** Merged History Encounter **       Social Drivers of Health   Financial Resource Strain: Not on file  Food Insecurity: No Food Insecurity (08/11/2024)   Hunger Vital Sign    Worried About Running Out of Food in the Last Year: Never true    Ran Out of Food in the Last Year: Never true  Transportation Needs: No Transportation Needs (08/11/2024)   PRAPARE - Administrator, Civil Service (Medical): No    Lack of Transportation (Non-Medical): No  Physical Activity: Not on file  Stress: Not on file  Social Connections: Not on file  Intimate Partner Violence: Not At Risk (08/11/2024)   Humiliation, Afraid, Rape, and Kick questionnaire    Fear of Current or Ex-Partner: No    Emotionally Abused: No    Physically Abused: No    Sexually Abused: No    Family History  Problem Relation Age of Onset   Hypertension Mother    Diabetes Mother    Cancer Father      Rolinda Rogue, MD 08/15/24 [phone]

## 2024-08-15 NOTE — Progress Notes (Signed)
 Discharge Nurse Summary: DC order noted per MD. DC RN at bedside with patient. Patient agreeable with discharge plan, states family will arrive soon for pickup. AVS printed/reviewed. PIV removed, skin intact. No DME needs. No home meds. TOC meds delivered to the patient. CP/Edu resolved. Telemonitor not present on assessment. All belongings accounted for. Patient wheeled downstairs for discharge by private auto.   Rosario EMERSON Lund, RN

## 2024-08-15 NOTE — Plan of Care (Signed)

## 2024-08-16 ENCOUNTER — Ambulatory Visit: Admitting: Obstetrics and Gynecology

## 2024-08-16 LAB — CULTURE, BLOOD (ROUTINE X 2)
Culture: NO GROWTH
Culture: NO GROWTH
Special Requests: ADEQUATE
Special Requests: ADEQUATE

## 2024-08-23 ENCOUNTER — Encounter: Payer: Self-pay | Admitting: Obstetrics and Gynecology

## 2024-08-23 ENCOUNTER — Other Ambulatory Visit (HOSPITAL_COMMUNITY)
Admission: RE | Admit: 2024-08-23 | Discharge: 2024-08-23 | Disposition: A | Source: Ambulatory Visit | Attending: Obstetrics and Gynecology | Admitting: Obstetrics and Gynecology

## 2024-08-23 ENCOUNTER — Ambulatory Visit: Admitting: Obstetrics and Gynecology

## 2024-08-23 VITALS — BP 117/73 | HR 86 | Ht 61.0 in | Wt 106.0 lb

## 2024-08-23 DIAGNOSIS — Z124 Encounter for screening for malignant neoplasm of cervix: Secondary | ICD-10-CM

## 2024-08-23 DIAGNOSIS — N730 Acute parametritis and pelvic cellulitis: Secondary | ICD-10-CM

## 2024-08-23 NOTE — Progress Notes (Signed)
 Bilateral ovarian infection diagnosed in hospital admitted on 11/1-11/5. Denies any pain, discharge, bleeding at this time.  Pt would just like to discuss future treatment plan.

## 2024-08-23 NOTE — Progress Notes (Signed)
  CC: hospital PID follow up Subjective:    Patient ID: Olivia Farrell, female    DOB: 26-Sep-1991, 33 y.o.   MRN: [id number]  HPI 33 yo G1P1, c/s x 1, seen for hospital follow up of PID.  During hospital stay, outdated IUD removed as possible source of infection.  Pt received rocephin, doxy and flagyl.  WBC improved and pain resolved.  Pt denies pain today, and she is still taking abx.  No TOA or ovarian mass noted, was diagnosed with PID.   Review of Systems     Objective:   Physical Exam Constitutional:      Appearance: Normal appearance. She is normal weight.  Cardiovascular:     Rate and Rhythm: Normal rate and regular rhythm.  Pulmonary:     Effort: Pulmonary effort is normal.     Breath sounds: Normal breath sounds.  Abdominal:     General: Abdomen is flat. There is no distension.     Palpations: Abdomen is soft. There is no mass.     Tenderness: There is no abdominal tenderness.  Neurological:     Mental Status: She is alert.    Vitals:   08/23/24 1454  BP: 117/73  Pulse: 86   Physical Exam Constitutional:      Appearance: Normal appearance. She is normal weight.  Cardiovascular:     Rate and Rhythm: Normal rate and regular rhythm.  Pulmonary:     Effort: Pulmonary effort is normal.     Breath sounds: Normal breath sounds.  Abdominal:     General: Abdomen is flat. There is no distension.     Palpations: Abdomen is soft. There is no mass.     Tenderness: There is no abdominal tenderness.  Neurological:     Mental Status: She is alert.     SSE: ext gen WNL          Vagina WNL          Cervix CNL with no abnl discharge  SVE: no CMT, no uterine or adnexal pain     Assessment & Plan:   1. PID (acute pelvic inflammatory disease) (Primary) Infection appears resolved Continue oral antibiotics until completion Advise protected intercourse if possible starting in 2-3 weeks  2. Pap smear for cervical cancer screening No record seen in chart Pap taken to  meet guidelines - Cytology - PAP( [place])    Jerilynn DELENA Buddle, MD Faculty Attending, [facility]

## 2024-08-28 LAB — CYTOLOGY - PAP
Comment: NEGATIVE
Comment: NEGATIVE
Comment: NEGATIVE
Diagnosis: NEGATIVE
HPV 16: NEGATIVE
HPV 18 / 45: NEGATIVE
High risk HPV: POSITIVE — AB

## 2024-08-29 ENCOUNTER — Ambulatory Visit: Payer: Self-pay | Admitting: Obstetrics and Gynecology

## 2024-09-25 ENCOUNTER — Other Ambulatory Visit (HOSPITAL_COMMUNITY)
Admission: RE | Admit: 2024-09-25 | Discharge: 2024-09-25 | Disposition: A | Source: Ambulatory Visit | Attending: Obstetrics and Gynecology | Admitting: Obstetrics and Gynecology

## 2024-09-25 ENCOUNTER — Encounter: Payer: Self-pay | Admitting: Obstetrics and Gynecology

## 2024-09-25 ENCOUNTER — Ambulatory Visit: Admitting: Obstetrics and Gynecology

## 2024-09-25 VITALS — BP 100/62 | HR 66 | Ht 61.0 in | Wt 110.8 lb

## 2024-09-25 DIAGNOSIS — R87810 Cervical high risk human papillomavirus (HPV) DNA test positive: Secondary | ICD-10-CM

## 2024-09-25 DIAGNOSIS — R87610 Atypical squamous cells of undetermined significance on cytologic smear of cervix (ASC-US): Secondary | ICD-10-CM

## 2024-09-25 NOTE — Progress Notes (Signed)
 Pt presents for colpo. No concerns

## 2024-09-25 NOTE — Progress Notes (Signed)
° ° °  GYNECOLOGY CLINIC COLPOSCOPY PROCEDURE NOTE  33 y.o. G1P0101 here for colposcopy for pap finding of:  Result Date Procedure Results Follow-ups  08/23/2024 Cytology - PAP( [place]) High risk HPV: Positive (A) HPV 16: Negative HPV 18 / 45: Negative Adequacy: Satisfactory for evaluation; transformation zone component PRESENT. Diagnosis: - Negative for intraepithelial lesion or malignancy (NILM) Comment: Normal Reference Range HPV - Negative Comment: Normal Reference Range HPV 16- Negative Comment: Normal Reference Range HPV 16 18 45 -Negative     Discussed role for HPV in cervical dysplasia, need for surveillance, nature of the procedure, and risks and benefits.  Pregnancy test: Lab Results  Component Value Date   PREGTESTUR Negative 08/11/2024    Allergies[1]  Patient given informed consent, signed copy in the chart, time out was performed.    Placed in lithotomy position. Cervix viewed with speculum and colposcope after application of acetic acid and lugol's.   Colposcopy Adequacy Cervix fully visualized: Yes  SCJ fully visualized: No    Colposcopy Findings no visible lesions, no mosaicism, no punctation, and no abnormal vasculature, no acetowhite changes or nonstaining with lugol's  Corresponding biopsies were not obtained.    ECC specimen was obtained.  All specimens were labeled and sent to pathology.  Hemostatic measures: None  Complications: none  Patient tolerated the procedure well.  OBGyn Exam  Colposcopy Impressions Normal  Plan Follow up on ECC, if negative repap in 1 year  Patient was given post procedure instructions.  Will follow up pathology and manage accordingly; patient will be contacted with results and recommendations.  Routine preventative health maintenance measures emphasized.   Jerilynn DELENA Buddle, MD     [1] No Known Allergies

## 2024-09-27 LAB — SURGICAL PATHOLOGY

## 2024-09-28 ENCOUNTER — Ambulatory Visit: Payer: Self-pay | Admitting: Obstetrics and Gynecology
